# Patient Record
Sex: Male | Born: 1990 | Race: White | Hispanic: No | Marital: Married | Smoking: Never smoker
Health system: Southern US, Community
[De-identification: ages and names within clinical notes are randomized; demographics above are authoritative.]

## PROBLEM LIST (undated history)

## (undated) HISTORY — PX: KNEE SURGERY: SHX244

---

## 2015-09-21 ENCOUNTER — Emergency Department (HOSPITAL_COMMUNITY): Payer: 59

## 2015-09-21 ENCOUNTER — Emergency Department (HOSPITAL_COMMUNITY)
Admission: EM | Admit: 2015-09-21 | Discharge: 2015-09-21 | Disposition: A | Discharge status: Home / Self Care | Payer: 59 | Attending: Emergency Medicine | Admitting: Emergency Medicine

## 2015-09-21 ENCOUNTER — Encounter (HOSPITAL_COMMUNITY): Payer: Self-pay | Admitting: Emergency Medicine

## 2015-09-21 DIAGNOSIS — Y998 Other external cause status: Secondary | ICD-10-CM | POA: Insufficient documentation

## 2015-09-21 DIAGNOSIS — Y9289 Other specified places as the place of occurrence of the external cause: Secondary | ICD-10-CM | POA: Diagnosis not present

## 2015-09-21 DIAGNOSIS — S0990XA Unspecified injury of head, initial encounter: Secondary | ICD-10-CM | POA: Diagnosis present

## 2015-09-21 DIAGNOSIS — Y9367 Activity, basketball: Secondary | ICD-10-CM | POA: Diagnosis not present

## 2015-09-21 DIAGNOSIS — W51XXXA Accidental striking against or bumped into by another person, initial encounter: Secondary | ICD-10-CM | POA: Diagnosis not present

## 2015-09-21 DIAGNOSIS — S060X0A Concussion without loss of consciousness, initial encounter: Secondary | ICD-10-CM | POA: Diagnosis not present

## 2015-09-21 DIAGNOSIS — R42 Dizziness and giddiness: Secondary | ICD-10-CM

## 2015-09-21 IMAGING — CT CT HEAD W/O CM
1 series · 16 of 30 positions shown, 20 images · non-contrast
Comparison: None.

CLINICAL DATA: Concussion playing basketball yesterday. Headache
and dizziness with trouble standing after the injury. Initial
encounter.

EXAM:
CT HEAD WITHOUT CONTRAST
TECHNIQUE: Contiguous axial images were obtained from the base of the skull
through the vertex without intravenous contrast.

[Series 2: head_seq 4.5 h37s st · axial · 0.50mm/px · z∈[-129,+15]mm · 16 of 36 slices shown, 20 images]
[im 2/36  brain]
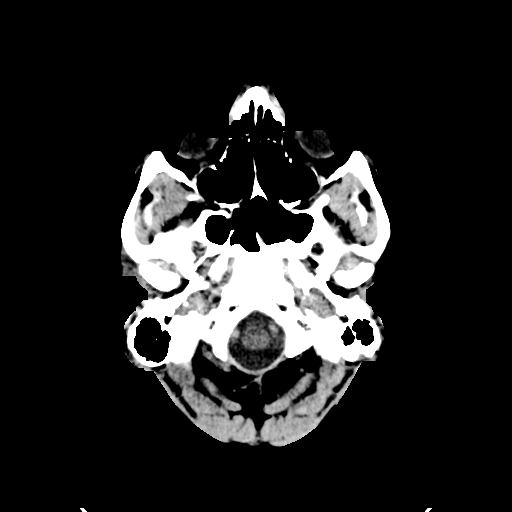
[im 2/36  bone]
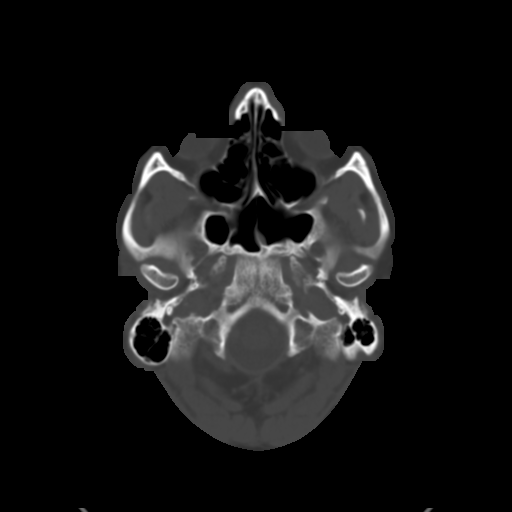
[im 4/36  brain]
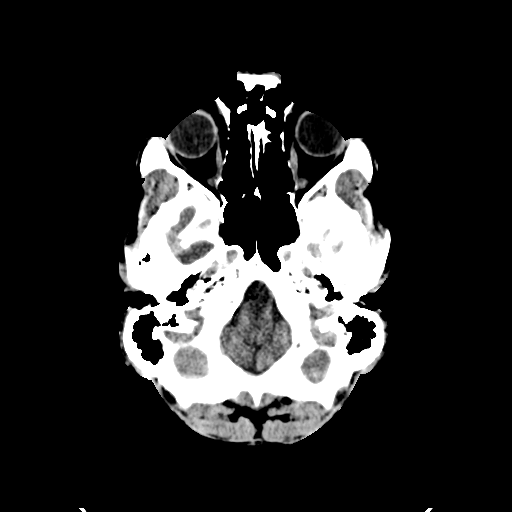
[im 7/36  brain]
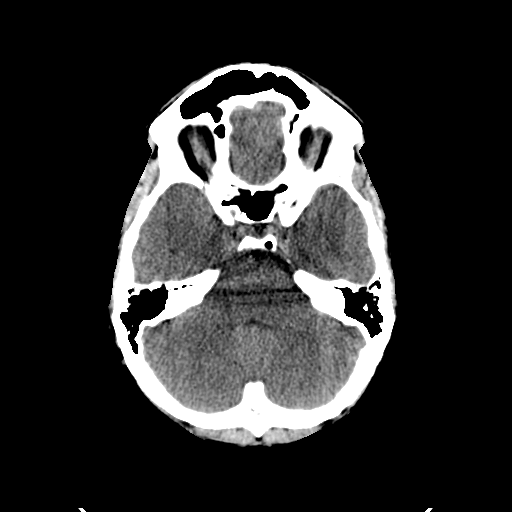
[im 9/36  brain]
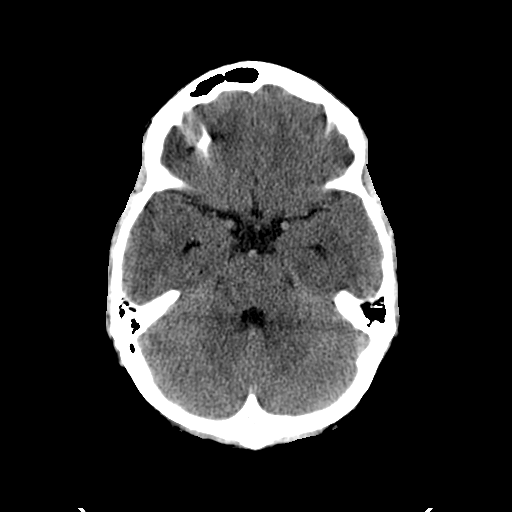
[im 10/36  brain]
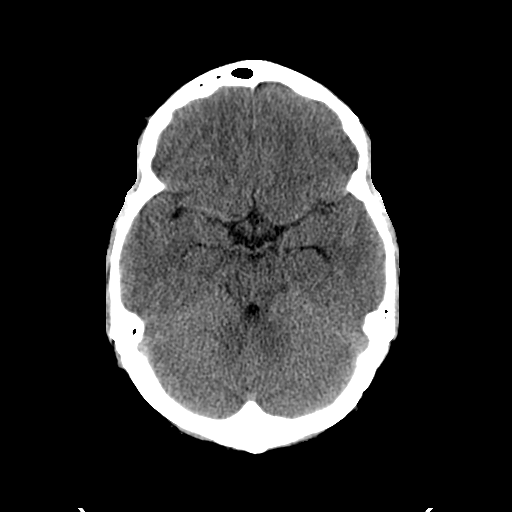
[im 10/36  bone]
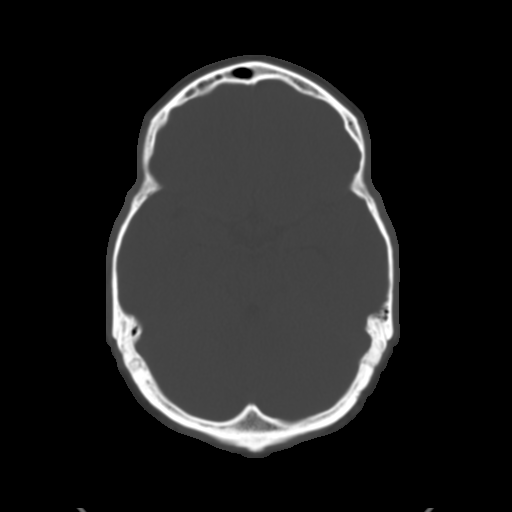
[im 13/36  brain]
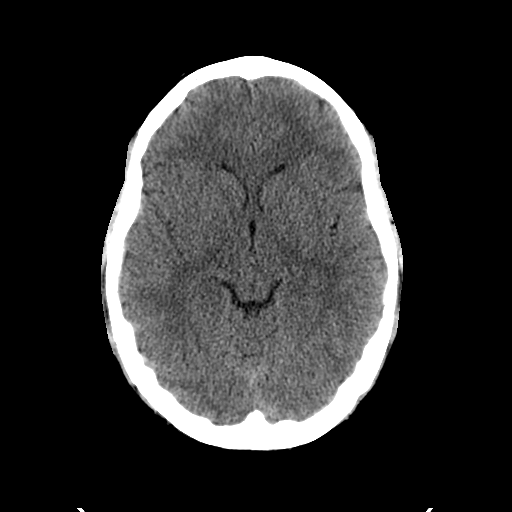
[im 15/36  brain]
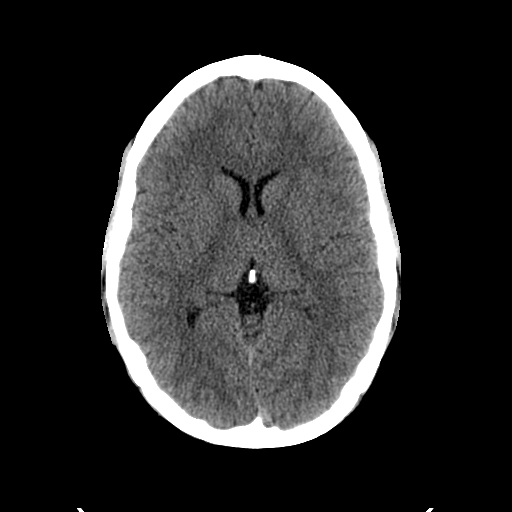
[im 17/36  brain]
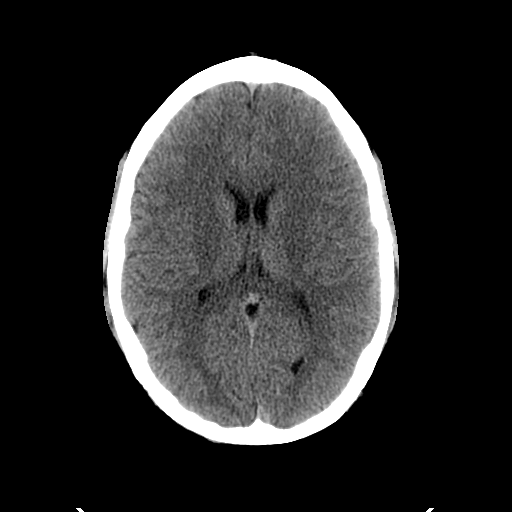
[im 19/36  brain]
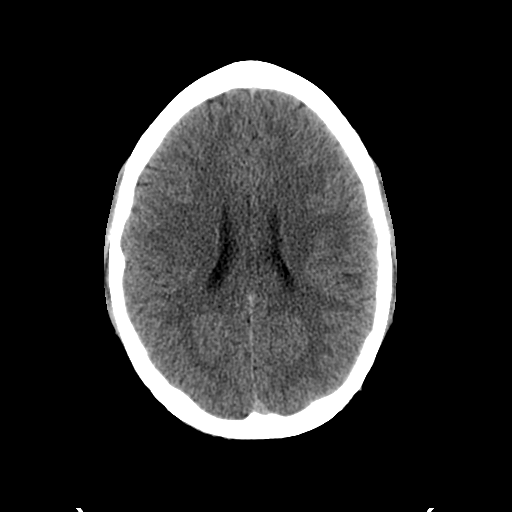
[im 19/36  bone]
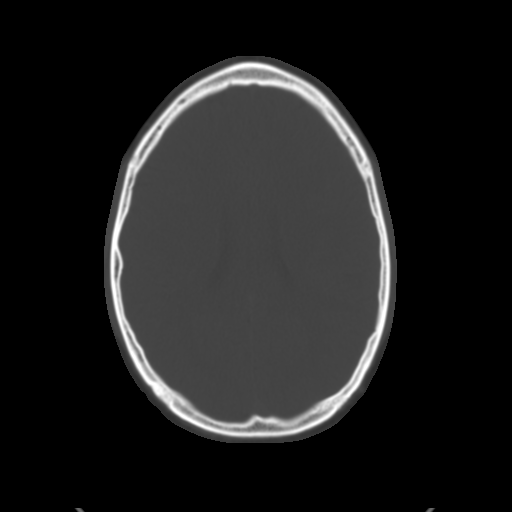
[im 21/36  brain]
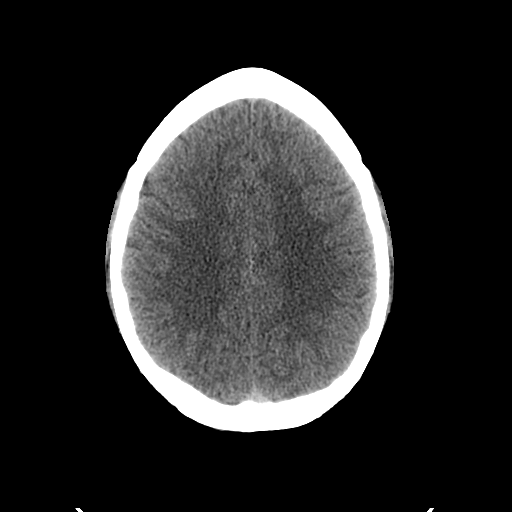
[im 23/36  brain]
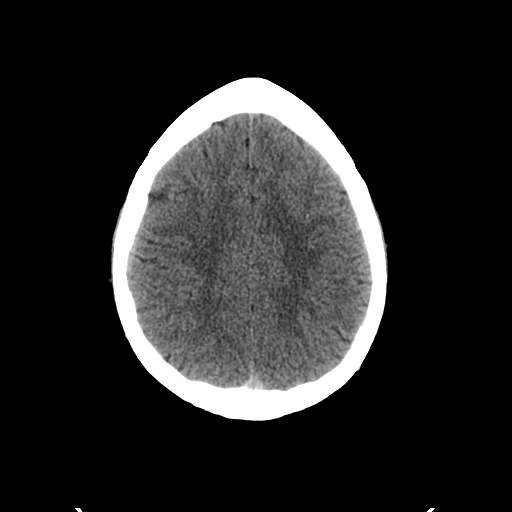
[im 26/36  brain]
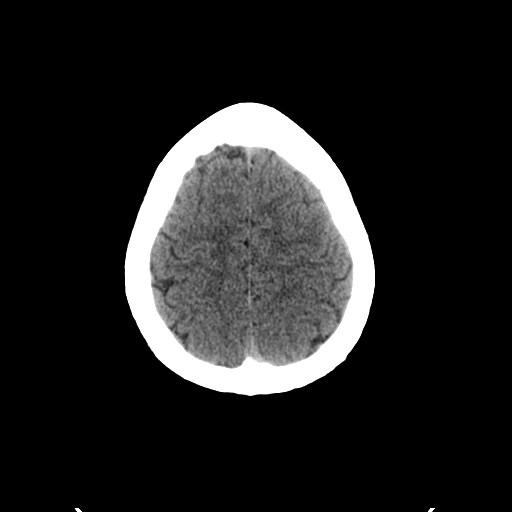
[im 27/36  brain]
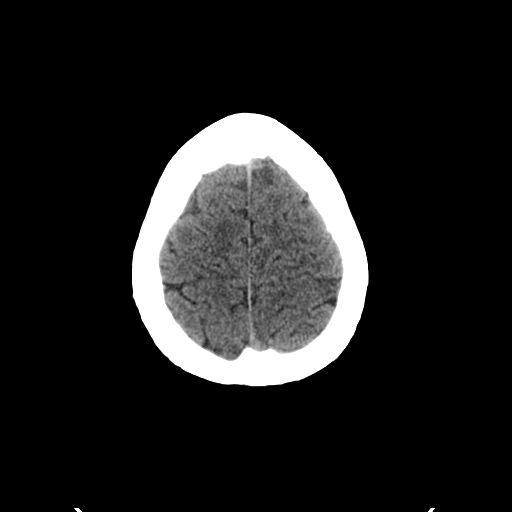
[im 27/36  bone]
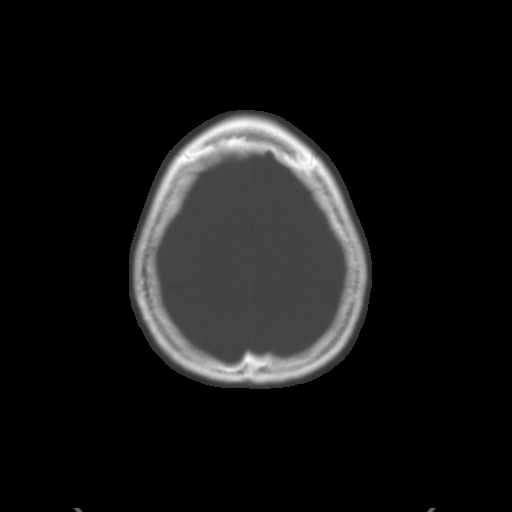
[im 29/36  brain]
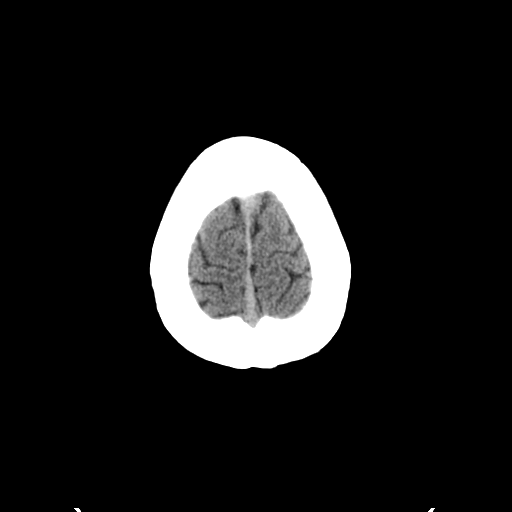
[im 32/36  brain]
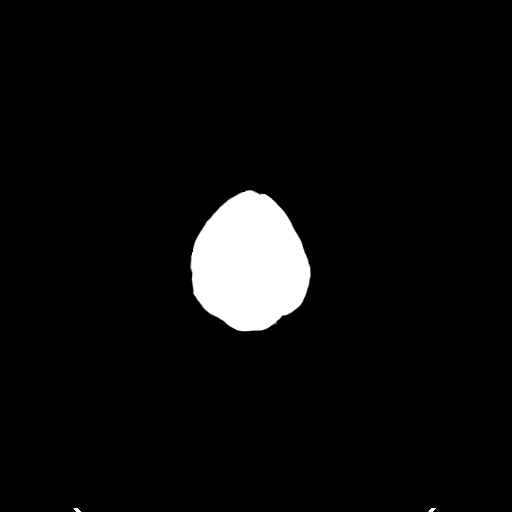
[im 34/36  brain]
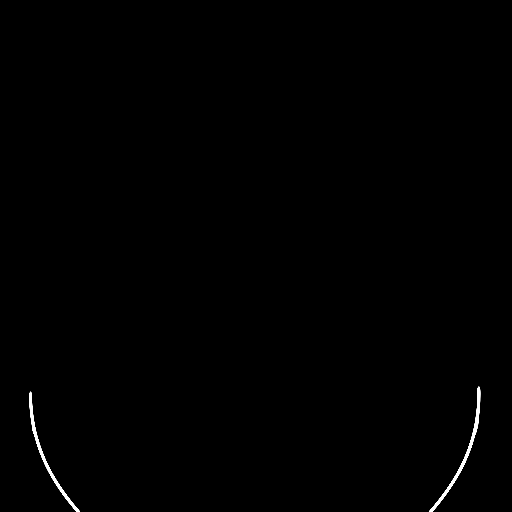

[16 of 30 positions shown; findings below may reference images not displayed]

FINDINGS: Skull and Sinuses:Negative for fracture or hemo sinus.

Visualized orbits: Negative.

Brain:  Normal.  No hemorrhage, swelling, infarct, or hydrocephalus.
IMPRESSION: Normal study.

## 2015-09-21 MED ORDER — MECLIZINE HCL 12.5 MG PO TABS: 12.5000 mg | ORAL_TABLET | Freq: Three times a day (TID) | ORAL | Status: DC | PRN

## 2015-09-21 MED ORDER — HYDROXYZINE HCL 25 MG PO TABS
25.0000 mg | ORAL_TABLET | Freq: Once | ORAL | Status: AC
  Administered 2015-09-21: 25 mg via ORAL
  Filled 2015-09-21: qty 1

## 2015-09-21 NOTE — Discharge Instructions (Signed)
Concussion, Adult  A concussion, or closed-head injury, is a brain injury caused by a direct blow to the head or by a quick and sudden movement (jolt) of the head or neck. Concussions are usually not life-threatening. Even so, the effects of a concussion can be serious. If you have had a concussion before, you are more likely to experience concussion-like symptoms after a direct blow to the head.   CAUSES  · Direct blow to the head, such as from running into another player during a soccer game, being hit in a fight, or hitting your head on a hard surface.  · A jolt of the head or neck that causes the brain to move back and forth inside the skull, such as in a car crash.  SIGNS AND SYMPTOMS  The signs of a concussion can be hard to notice. Early on, they may be missed by you, family members, and health care providers. You may look fine but act or feel differently.  Symptoms are usually temporary, but they may last for days, weeks, or even longer. Some symptoms may appear right away while others may not show up for hours or days. Every head injury is different. Symptoms include:  · Mild to moderate headaches that will not go away.  · A feeling of pressure inside your head.  · Having more trouble than usual:    Learning or remembering things you have heard.    Answering questions.    Paying attention or concentrating.    Organizing daily tasks.    Making decisions and solving problems.  · Slowness in thinking, acting or reacting, speaking, or reading.  · Getting lost or being easily confused.  · Feeling tired all the time or lacking energy (fatigued).  · Feeling drowsy.  · Sleep disturbances.    Sleeping more than usual.    Sleeping less than usual.    Trouble falling asleep.    Trouble sleeping (insomnia).  · Loss of balance or feeling lightheaded or dizzy.  · Nausea or vomiting.  · Numbness or tingling.  · Increased sensitivity to:    Sounds.    Lights.    Distractions.  · Vision problems or eyes that tire  easily.  · Diminished sense of taste or smell.  · Ringing in the ears.  · Mood changes such as feeling sad or anxious.  · Becoming easily irritated or angry for little or no reason.  · Lack of motivation.  · Seeing or hearing things other people do not see or hear (hallucinations).  DIAGNOSIS  Your health care provider can usually diagnose a concussion based on a description of your injury and symptoms. He or she will ask whether you passed out (lost consciousness) and whether you are having trouble remembering events that happened right before and during your injury.  Your evaluation might include:  · A brain scan to look for signs of injury to the brain. Even if the test shows no injury, you may still have a concussion.  · Blood tests to be sure other problems are not present.  TREATMENT  · Concussions are usually treated in an emergency department, in urgent care, or at a clinic. You may need to stay in the hospital overnight for further treatment.  · Tell your health care provider if you are taking any medicines, including prescription medicines, over-the-counter medicines, and natural remedies. Some medicines, such as blood thinners (anticoagulants) and aspirin, may increase the chance of complications. Also tell your health care   provider whether you have had alcohol or are taking illegal drugs. This information may affect treatment.  · Your health care provider will send you home with important instructions to follow.  · How fast you will recover from a concussion depends on many factors. These factors include how severe your concussion is, what part of your brain was injured, your age, and how healthy you were before the concussion.  · Most people with mild injuries recover fully. Recovery can take time. In general, recovery is slower in older persons. Also, persons who have had a concussion in the past or have other medical problems may find that it takes longer to recover from their current injury.  HOME  CARE INSTRUCTIONS  General Instructions  · Carefully follow the directions your health care provider gave you.  · Only take over-the-counter or prescription medicines for pain, discomfort, or fever as directed by your health care provider.  · Take only those medicines that your health care provider has approved.  · Do not drink alcohol until your health care provider says you are well enough to do so. Alcohol and certain other drugs may slow your recovery and can put you at risk of further injury.  · If it is harder than usual to remember things, write them down.  · If you are easily distracted, try to do one thing at a time. For example, do not try to watch TV while fixing dinner.  · Talk with family members or close friends when making important decisions.  · Keep all follow-up appointments. Repeated evaluation of your symptoms is recommended for your recovery.  · Watch your symptoms and tell others to do the same. Complications sometimes occur after a concussion. Older adults with a brain injury may have a higher risk of serious complications, such as a blood clot on the brain.  · Tell your teachers, school nurse, school counselor, coach, athletic trainer, or work manager about your injury, symptoms, and restrictions. Tell them about what you can or cannot do. They should watch for:    Increased problems with attention or concentration.    Increased difficulty remembering or learning new information.    Increased time needed to complete tasks or assignments.    Increased irritability or decreased ability to cope with stress.    Increased symptoms.  · Rest. Rest helps the brain to heal. Make sure you:    Get plenty of sleep at night. Avoid staying up late at night.    Keep the same bedtime hours on weekends and weekdays.    Rest during the day. Take daytime naps or rest breaks when you feel tired.  · Limit activities that require a lot of thought or concentration. These include:    Doing homework or job-related  work.    Watching TV.    Working on the computer.  · Avoid any situation where there is potential for another head injury (football, hockey, soccer, basketball, martial arts, downhill snow sports and horseback riding). Your condition will get worse every time you experience a concussion. You should avoid these activities until you are evaluated by the appropriate follow-up health care providers.  Returning To Your Regular Activities  You will need to return to your normal activities slowly, not all at once. You must give your body and brain enough time for recovery.  · Do not return to sports or other athletic activities until your health care provider tells you it is safe to do so.  · Ask   your health care provider when you can drive, ride a bicycle, or operate heavy machinery. Your ability to react may be slower after a brain injury. Never do these activities if you are dizzy.  · Ask your health care provider about when you can return to work or school.  Preventing Another Concussion  It is very important to avoid another brain injury, especially before you have recovered. In rare cases, another injury can lead to permanent brain damage, brain swelling, or death. The risk of this is greatest during the first 7-10 days after a head injury. Avoid injuries by:  · Wearing a seat belt when riding in a car.  · Drinking alcohol only in moderation.  · Wearing a helmet when biking, skiing, skateboarding, skating, or doing similar activities.  · Avoiding activities that could lead to a second concussion, such as contact or recreational sports, until your health care provider says it is okay.  · Taking safety measures in your home.    Remove clutter and tripping hazards from floors and stairways.    Use grab bars in bathrooms and handrails by stairs.    Place non-slip mats on floors and in bathtubs.    Improve lighting in dim areas.  SEEK MEDICAL CARE IF:  · You have increased problems paying attention or  concentrating.  · You have increased difficulty remembering or learning new information.  · You need more time to complete tasks or assignments than before.  · You have increased irritability or decreased ability to cope with stress.  · You have more symptoms than before.  Seek medical care if you have any of the following symptoms for more than 2 weeks after your injury:  · Lasting (chronic) headaches.  · Dizziness or balance problems.  · Nausea.  · Vision problems.  · Increased sensitivity to noise or light.  · Depression or mood swings.  · Anxiety or irritability.  · Memory problems.  · Difficulty concentrating or paying attention.  · Sleep problems.  · Feeling tired all the time.  SEEK IMMEDIATE MEDICAL CARE IF:  · You have severe or worsening headaches. These may be a sign of a blood clot in the brain.  · You have weakness (even if only in one hand, leg, or part of the face).  · You have numbness.  · You have decreased coordination.  · You vomit repeatedly.  · You have increased sleepiness.  · One pupil is larger than the other.  · You have convulsions.  · You have slurred speech.  · You have increased confusion. This may be a sign of a blood clot in the brain.  · You have increased restlessness, agitation, or irritability.  · You are unable to recognize people or places.  · You have neck pain.  · It is difficult to wake you up.  · You have unusual behavior changes.  · You lose consciousness.  MAKE SURE YOU:  · Understand these instructions.  · Will watch your condition.  · Will get help right away if you are not doing well or get worse.     This information is not intended to replace advice given to you by your health care provider. Make sure you discuss any questions you have with your health care provider.     Document Released: 08/11/2003 Document Revised: 06/11/2014 Document Reviewed: 12/11/2012  Elsevier Interactive Patient Education ©2016 [employer]

## 2015-09-21 NOTE — ED Provider Notes (Signed)
CSN: [id number]     Arrival date & time 09/21/15  1240 History   First MD Initiated Contact with Patient 09/21/15 1247     Chief Complaint  Patient presents with  . Head Injury  . Dizziness     (Consider location/radiation/quality/duration/timing/severity/associated sxs/prior Treatment) The history is provided by the patient.     PT SAID THAT HE WAS PLAYING BASKETBALL LAST NIGHT AND WAS HIT IN THE HEAD WITH SOMEONE'S ELBOW.  THE PT FELT DIZZY, BUT WENT BACK TO PLAYING.  HE DID NOT HAVE A LOC.  THE PT FELT OK LAST NIGHT.  TODAY, HE WOKE UP AND FELT DIZZY.  History reviewed. No pertinent past medical history. Past Surgical History  Procedure Laterality Date  . Knee surgery     History reviewed. No pertinent family history. Social History  Substance Use Topics  . Smoking status: None  . Smokeless tobacco: None  . Alcohol Use: None    Review of Systems  Neurological: Positive for dizziness.  All other systems reviewed and are negative.     Allergies  Review of patient's allergies indicates no known allergies.  Home Medications   Prior to Admission medications   Medication Sig Start Date End Date Taking? Authorizing Provider  aspirin-acetaminophen-caffeine (EXCEDRIN MIGRAINE) [phone] MG tablet Take 1 tablet by mouth every 6 (six) hours as needed for headache.   Yes Historical Provider, MD  diphenhydrAMINE (BENADRYL) 25 MG tablet Take 25 mg by mouth every 6 (six) hours as needed for allergies.   Yes Historical Provider, MD  ibuprofen (ADVIL,MOTRIN) 200 MG tablet Take 400 mg by mouth every 6 (six) hours as needed for headache.   Yes Historical Provider, MD  naproxen sodium (ANAPROX) 220 MG tablet Take 220 mg by mouth 2 (two) times daily as needed (headache).   Yes Historical Provider, MD   BP 146/94 mmHg  Pulse 66  Temp(Src) 97.7 F (36.5 C) (Oral)  Resp 18  SpO2 100% Physical Exam  Constitutional: He is oriented to person, place, and time. He appears well-developed  and well-nourished.  HENT:  Head: Normocephalic and atraumatic.  Right Ear: External ear normal.  Left Ear: External ear normal.  Mouth/Throat: Oropharynx is clear and moist.  Eyes: Conjunctivae are normal. Pupils are equal, round, and reactive to light.  Neck: Normal range of motion. Neck supple.  Cardiovascular: Normal rate, regular rhythm, normal heart sounds and intact distal pulses.   Pulmonary/Chest: Effort normal and breath sounds normal.  Abdominal: Soft. Bowel sounds are normal.  Musculoskeletal: Normal range of motion.  Neurological: He is alert and oriented to person, place, and time.  Skin: Skin is warm and dry.  Psychiatric: He has a normal mood and affect.  Vitals reviewed.   ED Course  Procedures (including critical care time) Labs Review Labs Reviewed - No data to display  Imaging Review Ct Head Wo Contrast  09/21/2015  CLINICAL DATA:  Concussion playing basketball yesterday. Headache and dizziness with trouble standing after the injury. Initial encounter. EXAM: CT HEAD WITHOUT CONTRAST TECHNIQUE: Contiguous axial images were obtained from the base of the skull through the vertex without intravenous contrast. COMPARISON:  None. FINDINGS: Skull and Sinuses:Negative for fracture or hemo sinus. Visualized orbits: Negative. Brain:  Normal.  No hemorrhage, swelling, infarct, or hydrocephalus. IMPRESSION: Normal study. Electronically Signed   By: Marnee SpringJonathon  Watts M.D.   On: 09/21/2015 13:45   I have personally reviewed and evaluated these images and lab results as part of my medical decision-making.   EKG  Interpretation None      MDM  PT IMPROVED. Final diagnoses:  None   CONCUSSION VERTIGO    Jacalyn Lefevre, MD 09/21/15 1402

## 2015-09-21 NOTE — ED Notes (Signed)
Playing basketball yesterday, took a shoulder to the head. Did not lose consciousness, c/o headache, dizziness and trouble standing shortly after the injury. Today states the symptoms are worse with nausea, dizziness, headache, feeling shaky, and having difficulty concentrating.

## 2015-09-21 NOTE — ED Notes (Signed)
Patient transported to CT 

## 2016-10-26 LAB — BASIC METABOLIC PANEL
BUN: 15 (ref 4–21)
CO2: 29 — AB (ref 13–22)
Chloride: 104 (ref 99–108)
Creatinine: 0.9 (ref 0.6–1.3)
Glucose: 103
Potassium: 4.3 (ref 3.4–5.3)
Sodium: 141 (ref 137–147)

## 2016-10-26 LAB — PSA: PSA: 0.89

## 2019-10-29 ENCOUNTER — Ambulatory Visit (INDEPENDENT_AMBULATORY_CARE_PROVIDER_SITE_OTHER): Payer: Commercial Managed Care - PPO | Admitting: Osteopathic Medicine

## 2019-10-29 ENCOUNTER — Encounter: Payer: Self-pay | Admitting: Osteopathic Medicine

## 2019-10-29 VITALS — BP 116/83 | HR 63 | Ht 69.0 in | Wt 173.0 lb

## 2019-10-29 DIAGNOSIS — Z0000 Encounter for general adult medical examination without abnormal findings: Secondary | ICD-10-CM | POA: Diagnosis not present

## 2019-10-29 NOTE — Progress Notes (Signed)
Dean Spence is a 29 y.o. male who presents to  [facility]  today, 10/29/19, seeking care for the following: . Annual check-up     ASSESSMENT & PLAN with other pertinent history/findings:  The encounter diagnosis was Annual physical exam.     Patient Instructions  General Preventive Care  Most recent routine screening labs: ordered today.   Everyone should have blood pressure checked once per year. Goal 130/80 or less.   Tobacco: don't!   Alcohol: responsible moderation is ok for most adults - if you have concerns about your alcohol intake, please talk to me!   Exercise: as tolerated to reduce risk of cardiovascular disease and diabetes. Strength training will also prevent osteoporosis.   Mental health: if need for mental health care (medicines, counseling, other), or concerns about moods, please let me know!   Sexual health: if need for STD testing, or if concerns with libido/pain problems, please let me know!   Advanced Directive: Living Will and/or Healthcare Power of Attorney recommended for all adults, regardless of age or health.  Vaccines  Flu vaccine: for almost everyone, every fall.   Shingles vaccine: after age 53.   Pneumonia vaccines: after age 24  Tetanus booster: every 10 years   HPV vaccine: Gardasil up to age 3 to prevent HPV-associated diseases, including certain cancers.   COVID vaccine: THANKS! :) Cancer screenings   Colon cancer screening: for everyone age 63-75.   Prostate cancer screening: PSA blood test age 40-71  Lung cancer screening: not needed for non-smokers  Infection screenings  . HIV: recommended screening at least once age 20-65, more often as needed. . Gonorrhea/Chlamydia: screening as needed . Hepatitis C: recommended once for everyone age 81-75 . TB: certain at-risk populations, or depending on work requirements and/or travel history Other . Bone Density Test:  recommended at age 67, sooner depending on risk factors . Abdominal Aortic Aneurysm: not needed for non-smokers     Orders Placed This Encounter  Procedures  . CBC  . COMPLETE METABOLIC PANEL WITH GFR  . Lipid panel    No orders of the defined types were placed in this encounter.    Constitutional:  . VSS, see nurse notes . General Appearance: alert, well-developed, well-nourished, NAD Eyes: Marland Kitchen Normal lids and conjunctive, non-icteric sclera . PERRLA Neck: . No masses, trachea midline . No thyroid enlargement/tenderness/mass appreciated Respiratory: . Normal respiratory effort . No dullness/hyper-resonance to percussion . Breath sounds normal, no wheeze/rhonchi/rales Cardiovascular: . S1/S2 normal, no murmur/rub/gallop auscultated . No lower extremity edema Gastrointestinal: . Nontender, no masses . No hepatomegaly, no splenomegaly . No hernia appreciated Musculoskeletal:  . Gait normal . No clubbing/cyanosis of digits Neurological: . No cranial nerve deficit on limited exam . Motor and sensation intact and symmetric Psychiatric: . Normal judgment/insight . Normal mood and affect   Follow-up instructions: Return in about 1 year (around 10/28/2020) for [place] (call week prior to visit for lab orders).                                         BP 116/83 (BP Location: Left Arm, Patient Position: Sitting)   Pulse 63   Ht 5\' 9"  (1.753 m)   Wt 173 lb (78.5 kg)   SpO2 100%   BMI 25.55 kg/m   No outpatient medications have been marked as taking for the  10/29/19 encounter (Office Visit) with Sunnie Nielsen, DO.    No results found for this or any previous visit (from the past 72 hour(s)).  No results found.  Depression screen [facility] 2/9 10/29/2019  Decreased Interest 0  Down, Depressed, Hopeless 0  PHQ - 2 Score 0  Altered sleeping 2  Tired, decreased energy 0  Change in appetite 0  Feeling bad or failure about yourself   0  Trouble concentrating 0  Moving slowly or fidgety/restless 0  PHQ-9 Score 2  Difficult doing work/chores Somewhat difficult    GAD 7 : Generalized Anxiety Score 10/29/2019  Nervous, Anxious, on Edge 1  Control/stop worrying 1  Worry too much - different things 1  Trouble relaxing 1  Restless 0  Easily annoyed or irritable 0  Afraid - awful might happen 1  Total GAD 7 Score 5  Anxiety Difficulty Somewhat difficult      All questions at time of visit were answered - patient instructed to contact office with any additional concerns or updates.  ER/RTC precautions were reviewed with the patient.  Please note: voice recognition software was used to produce this document, and typos may escape review. Please contact Dr. Lyn Hollingshead for any needed clarifications.

## 2019-10-29 NOTE — Patient Instructions (Signed)
General Preventive Care  Most recent routine screening labs: ordered today.   Everyone should have blood pressure checked once per year. Goal 130/80 or less.   Tobacco: don't!   Alcohol: responsible moderation is ok for most adults - if you have concerns about your alcohol intake, please talk to me!   Exercise: as tolerated to reduce risk of cardiovascular disease and diabetes. Strength training will also prevent osteoporosis.   Mental health: if need for mental health care (medicines, counseling, other), or concerns about moods, please let me know!   Sexual health: if need for STD testing, or if concerns with libido/pain problems, please let me know!   Advanced Directive: Living Will and/or Healthcare Power of Attorney recommended for all adults, regardless of age or health.  Vaccines  Flu vaccine: for almost everyone, every fall.   Shingles vaccine: after age 58.   Pneumonia vaccines: after age 22  Tetanus booster: every 10 years   HPV vaccine: Gardasil up to age 30 to prevent HPV-associated diseases, including certain cancers.   COVID vaccine: THANKS! :) Cancer screenings   Colon cancer screening: for everyone age 55-75.   Prostate cancer screening: PSA blood test age 28-71  Lung cancer screening: not needed for non-smokers  Infection screenings  . HIV: recommended screening at least once age 71-65, more often as needed. . Gonorrhea/Chlamydia: screening as needed . Hepatitis C: recommended once for everyone age 63-75 . TB: certain at-risk populations, or depending on work requirements and/or travel history Other . Bone Density Test: recommended at age 66, sooner depending on risk factors . Abdominal Aortic Aneurysm: not needed for non-smokers

## 2019-10-31 LAB — COMPLETE METABOLIC PANEL WITH GFR
AG Ratio: 2 (calc) (ref 1.0–2.5)
ALT: 9 U/L (ref 9–46)
AST: 12 U/L (ref 10–40)
Albumin: 4.7 g/dL (ref 3.6–5.1)
Alkaline phosphatase (APISO): 69 U/L (ref 36–130)
BUN: 19 mg/dL (ref 7–25)
CO2: 27 mmol/L (ref 20–32)
Calcium: 9.7 mg/dL (ref 8.6–10.3)
Chloride: 103 mmol/L (ref 98–110)
Creat: 0.98 mg/dL (ref 0.60–1.35)
GFR, Est African American: 121 mL/min/{1.73_m2} (ref >=60)
GFR, Est Non African American: 104 mL/min/{1.73_m2} (ref >=60)
Globulin: 2.3 g/dL (calc) (ref 1.9–3.7)
Glucose, Bld: 105 mg/dL — ABNORMAL HIGH (ref 65–99)
Potassium: 4.6 mmol/L (ref 3.5–5.3)
Sodium: 141 mmol/L (ref 135–146)
Total Bilirubin: 0.5 mg/dL (ref 0.2–1.2)
Total Protein: 7 g/dL (ref 6.1–8.1)

## 2019-10-31 LAB — CBC
HCT: 41.7 % (ref 38.5–50.0)
Hemoglobin: 14.8 g/dL (ref 13.2–17.1)
MCH: 31.6 pg (ref 27.0–33.0)
MCHC: 35.5 g/dL (ref 32.0–36.0)
MCV: 88.9 fL (ref 80.0–100.0)
MPV: 11.2 fL (ref 7.5–12.5)
Platelets: 191 10*3/uL (ref 140–400)
RBC: 4.69 10*6/uL (ref 4.20–5.80)
RDW: 11.9 % (ref 11.0–15.0)
WBC: 6.1 10*3/uL (ref 3.8–10.8)

## 2019-10-31 LAB — LIPID PANEL
Cholesterol: 197 mg/dL (ref <200)
HDL: 53 mg/dL (ref >=40)
LDL Cholesterol (Calc): 122 mg/dL (calc) — ABNORMAL HIGH
Non-HDL Cholesterol (Calc): 144 mg/dL (calc) — ABNORMAL HIGH (ref <130)
Total CHOL/HDL Ratio: 3.7 (calc) (ref <5.0)
Triglycerides: 112 mg/dL (ref <150)

## 2019-11-04 ENCOUNTER — Encounter: Payer: Self-pay | Admitting: Osteopathic Medicine

## 2019-11-04 DIAGNOSIS — R7301 Impaired fasting glucose: Secondary | ICD-10-CM

## 2019-11-04 DIAGNOSIS — E7800 Pure hypercholesterolemia, unspecified: Secondary | ICD-10-CM

## 2019-11-06 ENCOUNTER — Encounter: Payer: Self-pay | Admitting: Osteopathic Medicine

## 2019-11-06 NOTE — Telephone Encounter (Signed)
Pt sent a message on 11/04/19.

## 2020-02-11 ENCOUNTER — Encounter: Payer: Self-pay | Admitting: Osteopathic Medicine

## 2020-02-11 ENCOUNTER — Ambulatory Visit (INDEPENDENT_AMBULATORY_CARE_PROVIDER_SITE_OTHER): Payer: Commercial Managed Care - PPO | Admitting: Osteopathic Medicine

## 2020-02-11 ENCOUNTER — Other Ambulatory Visit: Payer: Self-pay

## 2020-02-11 VITALS — BP 134/86 | HR 62 | Wt 172.0 lb

## 2020-02-11 DIAGNOSIS — S83511A Sprain of anterior cruciate ligament of right knee, initial encounter: Secondary | ICD-10-CM

## 2020-02-11 DIAGNOSIS — S8991XA Unspecified injury of right lower leg, initial encounter: Secondary | ICD-10-CM

## 2020-02-11 NOTE — Patient Instructions (Signed)
Should get call re: MRI and ortho referral

## 2020-02-11 NOTE — Progress Notes (Signed)
Dean Spence is a 29 y.o. male who presents to  [facility]  today, 02/11/20, seeking care for the following:  . R knee pain - playing basketball, twisted and felt/heard a pop and felt knee instability on R. Hx previous knee surgeries, including ACL repair on R. He reports he's done w/ competitive sports, but wants to continue with golfing and hiking. Has been icing, compressing the knee, using NSAID's qhs. (+)pain to LCL, pain but no serious instability w/ posterior drawer. No significant effusion noted.      ASSESSMENT & PLAN with other pertinent findings:  The encounter diagnosis was Right knee injury, initial encounter - suspect ACL/PCL tear, meniscal involvement. Previous Biochemist, clinical .    Patient Instructions  Should get call re: MRI and ortho referral    Orders Placed This Encounter  Procedures  . MR Knee Right Wo Contrast    No orders of the defined types were placed in this encounter.      Follow-up instructions: Return if symptoms worsen or fail to improve.                                         BP 134/86 (BP Location: Right Arm, Patient Position: Sitting)   Pulse 62   Wt 172 lb (78 kg)   SpO2 100%   BMI 25.40 kg/m   No outpatient medications have been marked as taking for the 02/11/20 encounter (Office Visit) with Sunnie Nielsen, DO.    No results found for this or any previous visit (from the past 72 hour(s)).  No results found.     All questions at time of visit were answered - patient instructed to contact office with any additional concerns or updates.  ER/RTC precautions were reviewed with the patient as applicable.   Please note: voice recognition software was used to produce this document, and typos may escape review. Please contact Dr. Lyn Hollingshead for any needed clarifications.

## 2020-02-14 ENCOUNTER — Other Ambulatory Visit: Payer: Self-pay

## 2020-02-14 ENCOUNTER — Ambulatory Visit (INDEPENDENT_AMBULATORY_CARE_PROVIDER_SITE_OTHER): Payer: Commercial Managed Care - PPO

## 2020-02-14 DIAGNOSIS — S8991XA Unspecified injury of right lower leg, initial encounter: Secondary | ICD-10-CM

## 2020-02-14 IMAGING — MR MR KNEE*R* W/O CM
7 series · 40 of 40 positions shown · non-contrast
Comparison: None.

CLINICAL DATA: Injured knee 10 days ago. Persistent pain. Prior
history of ACL reconstruction.

EXAM:
MRI OF THE RIGHT KNEE WITHOUT CONTRAST
TECHNIQUE: Multiplanar, multisequence MR imaging of the knee was performed. No
intravenous contrast was administered.

[Series 3: T2 fat-sat · axial · 4.0mm · 0.53mm/px · z∈[-98,+72]mm · 6 of 35 slices shown (1 of 3)]
[im 1/35]
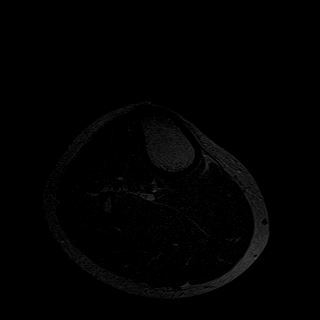
[im 7/35]
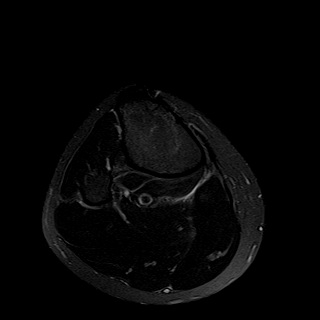
[im 14/35]
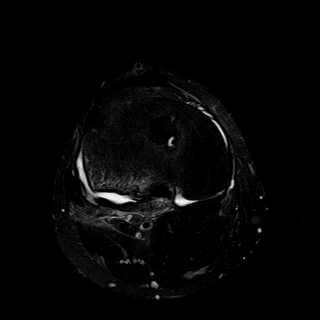
[im 21/35]
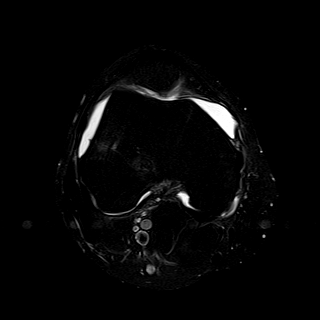
[im 28/35]
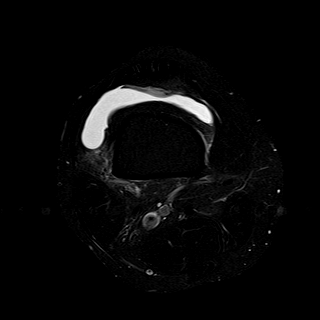
[im 35/35]
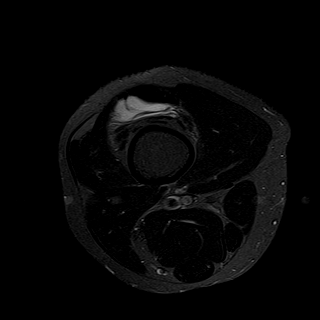

[Series 4: T1 · coronal · 4.0mm · 0.62mm/px · 6 of 29 slices shown]
[im 1/29]
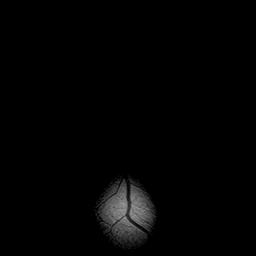
[im 6/29]
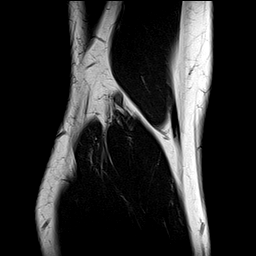
[im 12/29]
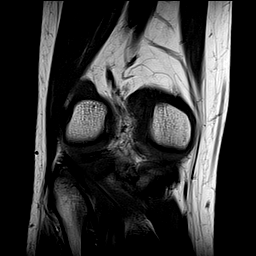
[im 17/29]
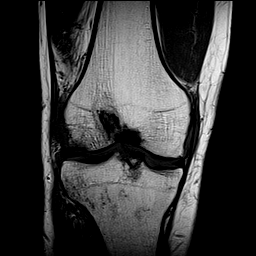
[im 23/29]
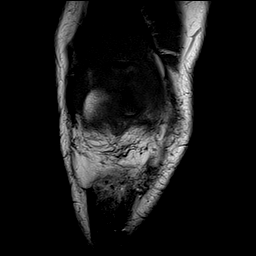
[im 29/29]
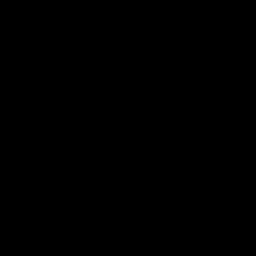

[Series 5: T2 fat-sat · coronal · 4.0mm · 0.62mm/px · 6 of 29 slices shown (2 of 3)]
[im 1/29]
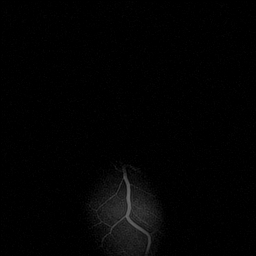
[im 6/29]
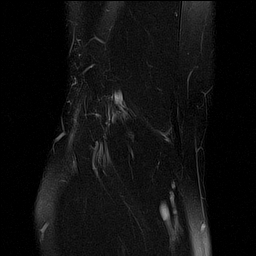
[im 12/29]
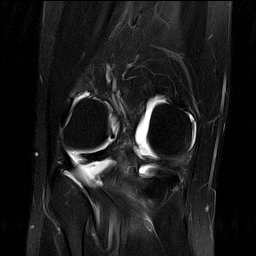
[im 17/29]
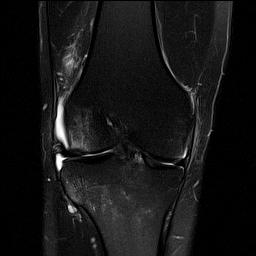
[im 23/29]
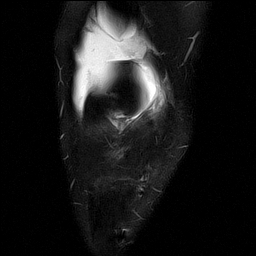
[im 29/29]
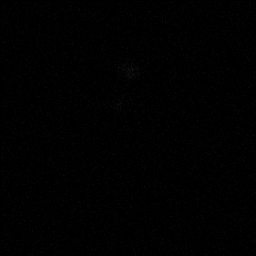

[Series 6: PD fat-sat · coronal · 4.0mm · 0.62mm/px · 6 of 29 slices shown (1 of 3)]
[im 1/29]
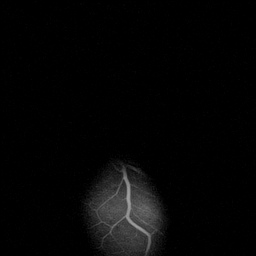
[im 6/29]
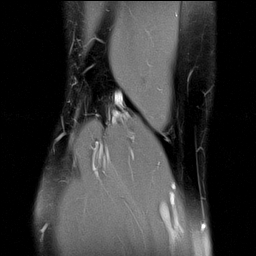
[im 12/29]
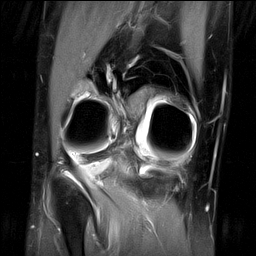
[im 17/29]
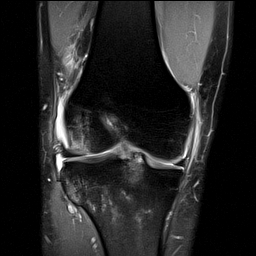
[im 23/29]
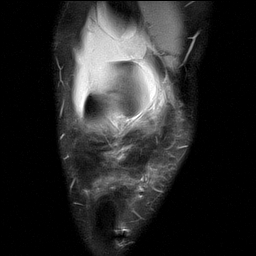
[im 29/29]
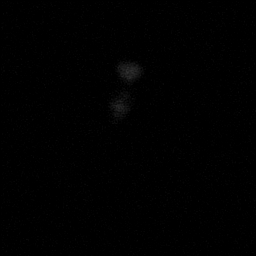

[Series 7: PD fat-sat · sagittal · 3.0mm · 0.62mm/px · 6 of 30 slices shown (2 of 3)]
[im 1/30]
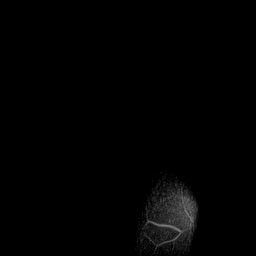
[im 6/30]
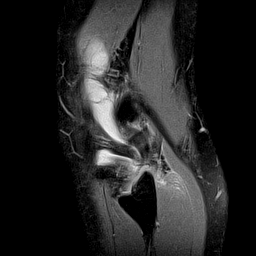
[im 12/30]
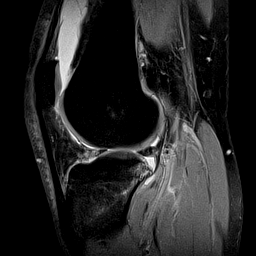
[im 18/30]
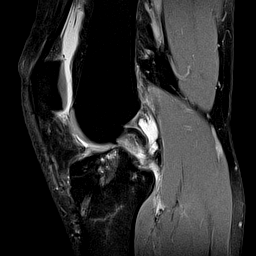
[im 24/30]
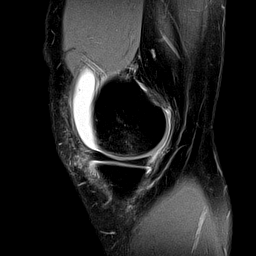
[im 30/30]
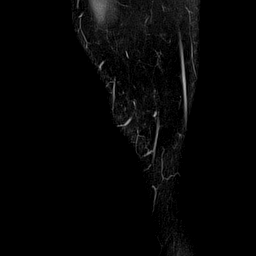

[Series 8: T2 fat-sat · sagittal · 3.0mm · 0.62mm/px · 6 of 30 slices shown (3 of 3)]
[im 1/30]
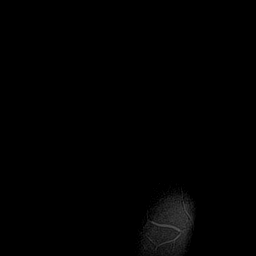
[im 6/30]
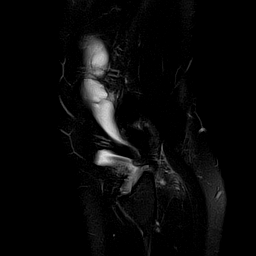
[im 12/30]
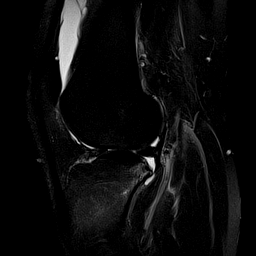
[im 18/30]
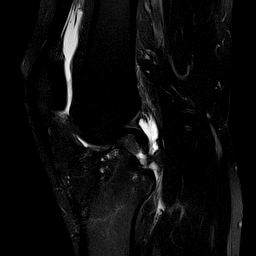
[im 24/30]
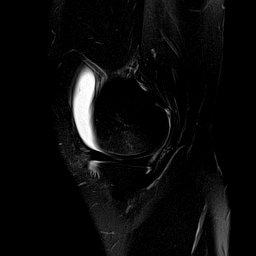
[im 30/30]
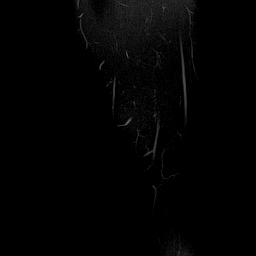

[Series 9: PD fat-sat · oblique · 2.0mm · 0.62mm/px · 4 of 19 slices shown (3 of 3)]
[im 1/19]
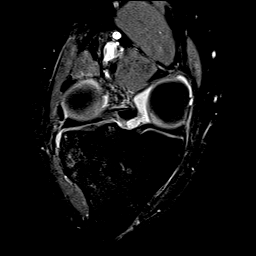
[im 7/19]
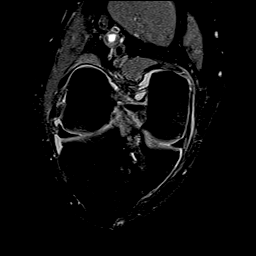
[im 13/19]
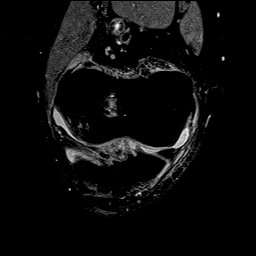
[im 19/19]
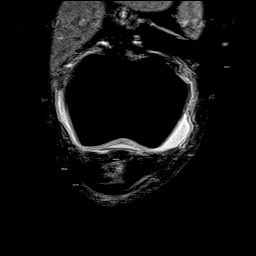

[40 of 40 positions shown; findings below may reference images not displayed]

FINDINGS: MENISCI

Medial meniscus:  Intact

Lateral meniscus:  Intact

LIGAMENTS

Cruciates: The ACL graft is ruptured. The PCL is intact but buckled.

Collaterals:  Intact.

CARTILAGE

Patellofemoral:  Normal

Medial:  Normal

Lateral:  Normal

Joint: Large joint effusion. Superior and medial patellar plica
noted.

Popliteal Fossa:  No popliteal mass or Baker's cyst.

Extensor Mechanism: The patella retinacular structures are intact
and the quadriceps and patellar tendons are intact. Surgical changes
related to prior patellar tendon graft harvest.

Bones:  Pivot-shift type bone contusions without discrete fracture.

Other: Unremarkable knee musculature.
IMPRESSION: 1. Complete ACL graft rupture with associated pivot-shift bone
contusions. The PCL and collateral ligaments are intact.
2. No meniscal tears and intact articular cartilage.
3. Large joint effusion.

## 2020-02-16 NOTE — Progress Notes (Signed)
This was the guy I messaged you about last week - recommendations? Varkey? Thanks!

## 2020-02-16 NOTE — Addendum Note (Signed)
Addended by: Deirdre Pippins on: 02/16/2020 01:07 PM   Modules accepted: Orders

## 2020-03-28 ENCOUNTER — Encounter: Payer: Self-pay | Admitting: Osteopathic Medicine

## 2020-12-19 ENCOUNTER — Encounter: Payer: Self-pay | Admitting: Osteopathic Medicine

## 2020-12-19 DIAGNOSIS — H9311 Tinnitus, right ear: Secondary | ICD-10-CM

## 2020-12-27 ENCOUNTER — Encounter: Payer: Self-pay | Admitting: Medical-Surgical

## 2020-12-27 ENCOUNTER — Other Ambulatory Visit: Payer: Self-pay

## 2020-12-27 ENCOUNTER — Ambulatory Visit (INDEPENDENT_AMBULATORY_CARE_PROVIDER_SITE_OTHER): Payer: Commercial Managed Care - PPO | Admitting: Medical-Surgical

## 2020-12-27 VITALS — BP 132/83 | HR 65 | Resp 20 | Ht 69.0 in | Wt 178.0 lb

## 2020-12-27 DIAGNOSIS — H9319 Tinnitus, unspecified ear: Secondary | ICD-10-CM | POA: Diagnosis not present

## 2020-12-27 DIAGNOSIS — E7800 Pure hypercholesterolemia, unspecified: Secondary | ICD-10-CM

## 2020-12-27 DIAGNOSIS — H6123 Impacted cerumen, bilateral: Secondary | ICD-10-CM | POA: Diagnosis not present

## 2020-12-27 DIAGNOSIS — R7301 Impaired fasting glucose: Secondary | ICD-10-CM

## 2020-12-27 DIAGNOSIS — Z0000 Encounter for general adult medical examination without abnormal findings: Secondary | ICD-10-CM

## 2020-12-27 DIAGNOSIS — M25562 Pain in left knee: Secondary | ICD-10-CM | POA: Diagnosis not present

## 2020-12-27 NOTE — Progress Notes (Signed)
HPI with pertinent ROS:   CC: ringing in the ear  HPI: Pleasant 30 year old male presenting for the following:  Tendinitis/cerumen impaction: Previously seen for right ear ringing in urgent care and was told that he had cerumen impaction.  They did do a lavage for when able to clear the wax completely.  He has been using the antibiotic drops that were prescribed for him.  He has been trying to clear the wax out at home and was able to clear quite a bit from his right ear last night.  He does continue to have ringing in the right ear but notes that his hearing is 85 to 90% back.  He does have a referral in place to ENT and has an appointment with them in about 3 weeks.  No fever, chills, ear drainage, dizziness, headaches, or nausea.  Left knee pain-he had surgery on his left knee approximately 8 years ago to repair a meniscus issue.  Approximately 2 months ago, he went on a beach trip.  Admits to lifting heavy luggage upstairs as well as being very active on the beach.  The next morning, he woke up with left knee pain that was debilitating and had trouble walking on it.  He has been working with physical therapy for the last 2 months with this knee pain but his symptoms have not completely resolved.  Now his pain is reduced but he does feel like he is hyperextending the knee whenever he walks.  He has had no swelling of the knee.  He has been using some intermittent light bracing but has not been taking anti-inflammatories as his pain was not severe.  Has not had any recent images of the left knee.  I reviewed the past medical history, family history, social history, surgical history, and allergies today and no changes were needed.  Please see the problem list section below in epic for further details.  Physical exam:   General: Well Developed, well nourished, and in no acute distress.  Neuro: Alert and oriented x3.  HEENT: Normocephalic, atraumatic.  Right external ear canal with moderate cerumen  buildup.  Unable to visualize left TM due to cerumen impaction. Skin: Warm and dry. Cardiac: Regular rate and rhythm, no murmurs rubs or gallops, no lower extremity edema.  Respiratory: Clear to auscultation bilaterally. Not using accessory muscles, speaking in full sentences. Left knee: Full range of motion to the left knee although extension does appear to be a bit hyperextended compared to the right knee.  He has had surgery in the right knee so this may not be a good comparison.  He is ambulating without difficulty.  No tenderness, instability, or effusion noted.  Impression and Recommendations:    1. Tinnitus, unspecified laterality May be related to eustachian tube dysfunction but also could be from cerumen impaction.  Recommend following up with ENT for further evaluation  2. Acute pain of left knee Getting x-rays today.  He has been doing therapy for approximately 2 months so he has failed conservative treatment.  If x-ray result comes back showing no significant identifiable issues, we will likely send off for MRI.  He already has a relationship with Dr. Everardo Pacific so no referrals placed today. - DG Knee Complete 4 Views Left; Future  3. Bilateral impacted cerumen Bilateral ear impaction noted.  Ear lavage completed by CMA in office.  After lavage, patient reported significant improvement in hearing. - Ear Lavage  Return if symptoms worsen or fail to improve. ___________________________________________ Ander Slade L.  Charna Archer, DNP, APRN, FNP-BC [facility]

## 2020-12-29 ENCOUNTER — Other Ambulatory Visit: Payer: Self-pay

## 2020-12-29 ENCOUNTER — Ambulatory Visit (INDEPENDENT_AMBULATORY_CARE_PROVIDER_SITE_OTHER): Payer: Commercial Managed Care - PPO

## 2020-12-29 DIAGNOSIS — M25562 Pain in left knee: Secondary | ICD-10-CM

## 2020-12-29 DIAGNOSIS — R7301 Impaired fasting glucose: Secondary | ICD-10-CM

## 2020-12-29 DIAGNOSIS — Z0000 Encounter for general adult medical examination without abnormal findings: Secondary | ICD-10-CM

## 2020-12-29 DIAGNOSIS — E7800 Pure hypercholesterolemia, unspecified: Secondary | ICD-10-CM

## 2020-12-29 IMAGING — DX DG KNEE COMPLETE 4+V*L*
4 series · 4 of 4 positions shown · non-contrast
Comparison: None.

CLINICAL DATA: Left knee pain for 2 months.

EXAM:
LEFT KNEE - COMPLETE 4+ VIEW

[knee ap]
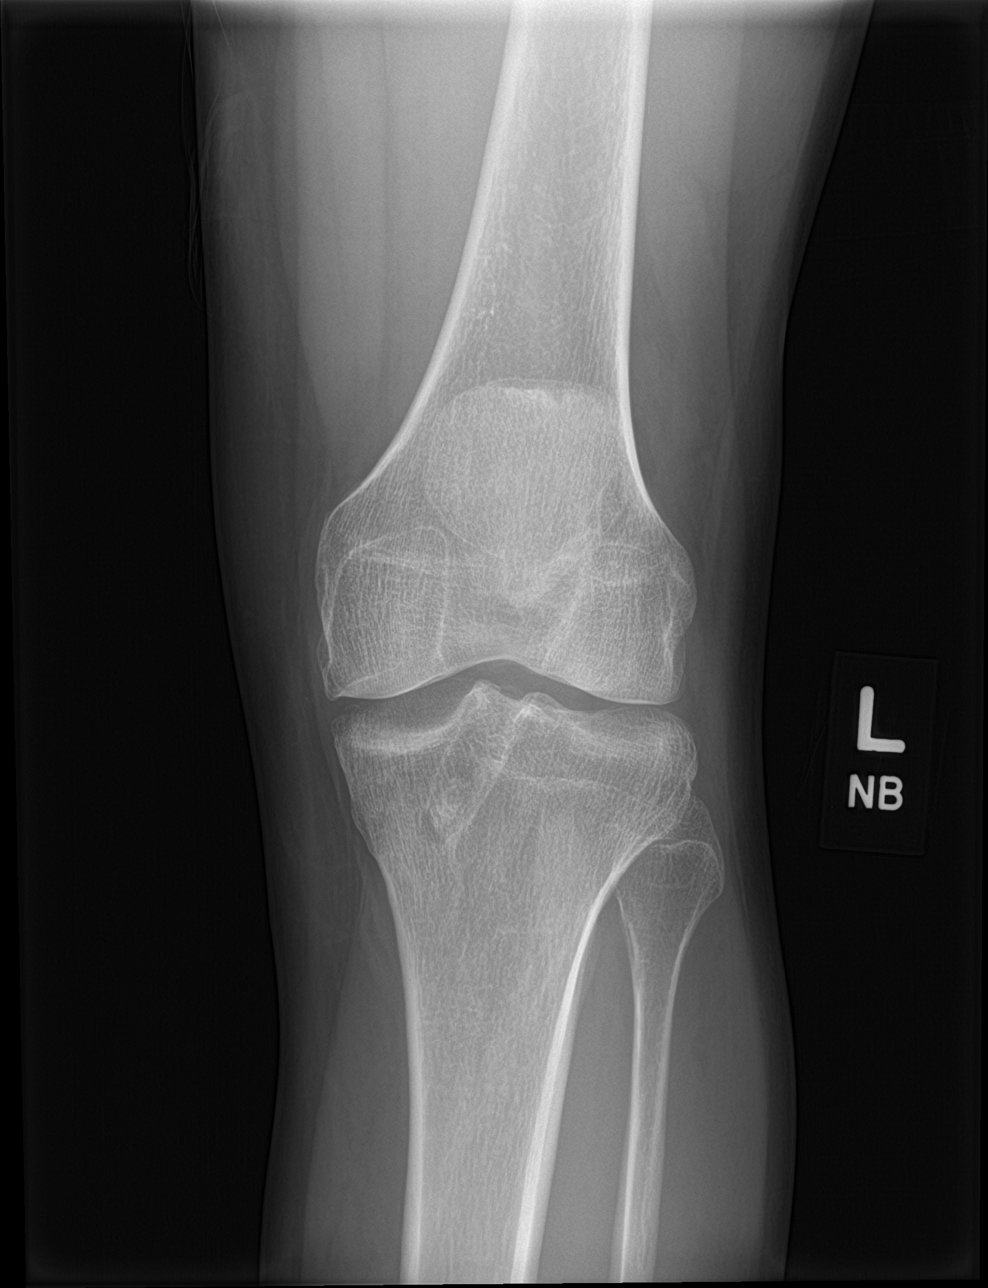

[knee obl (1 of 2)]
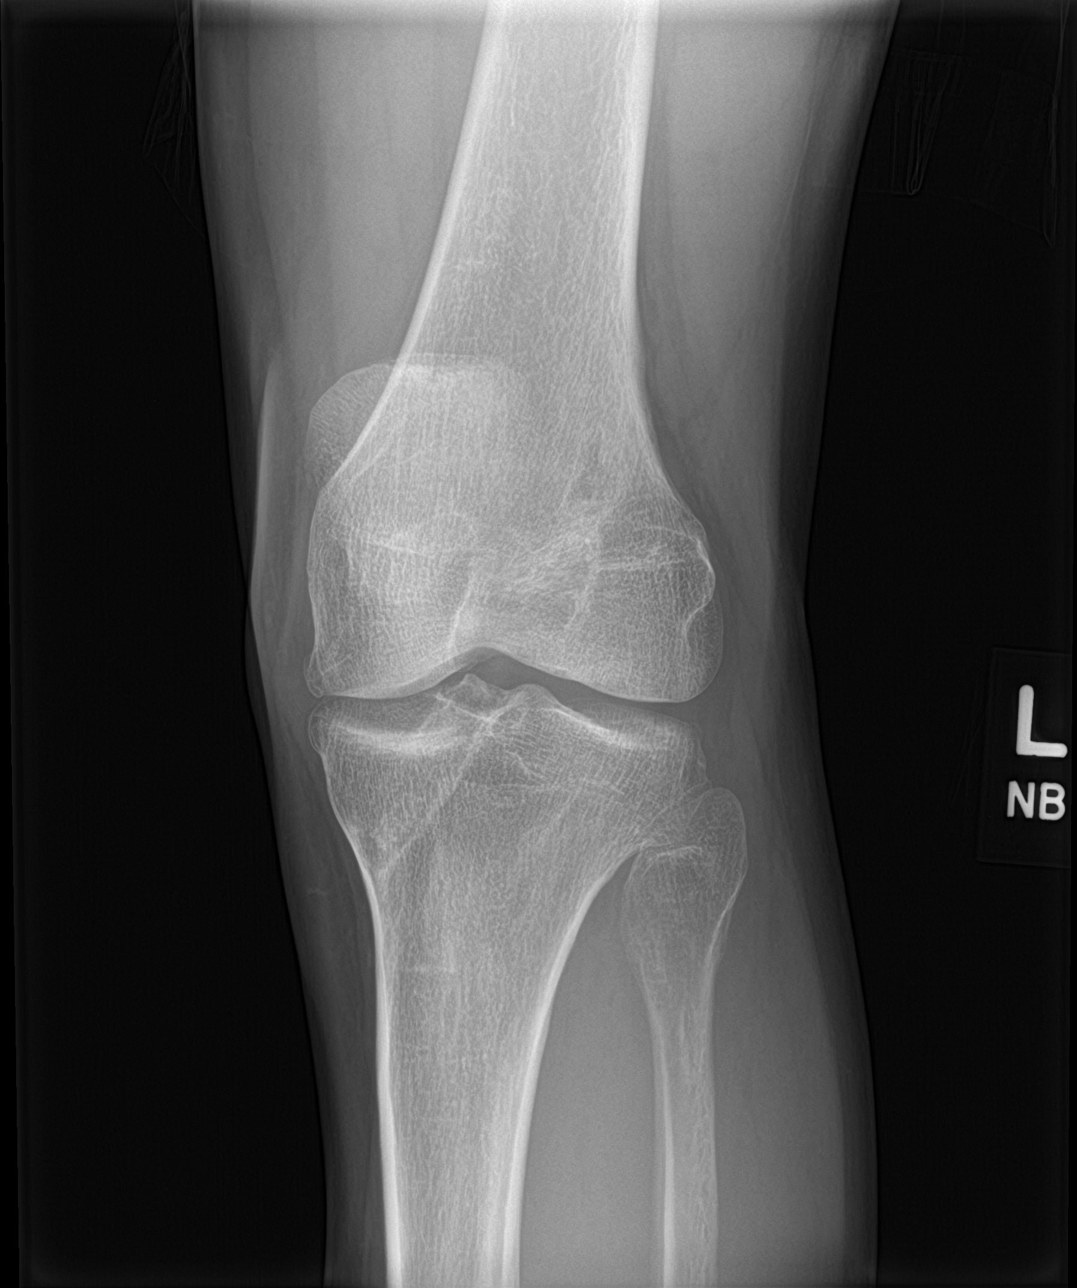

[knee obl (2 of 2)]
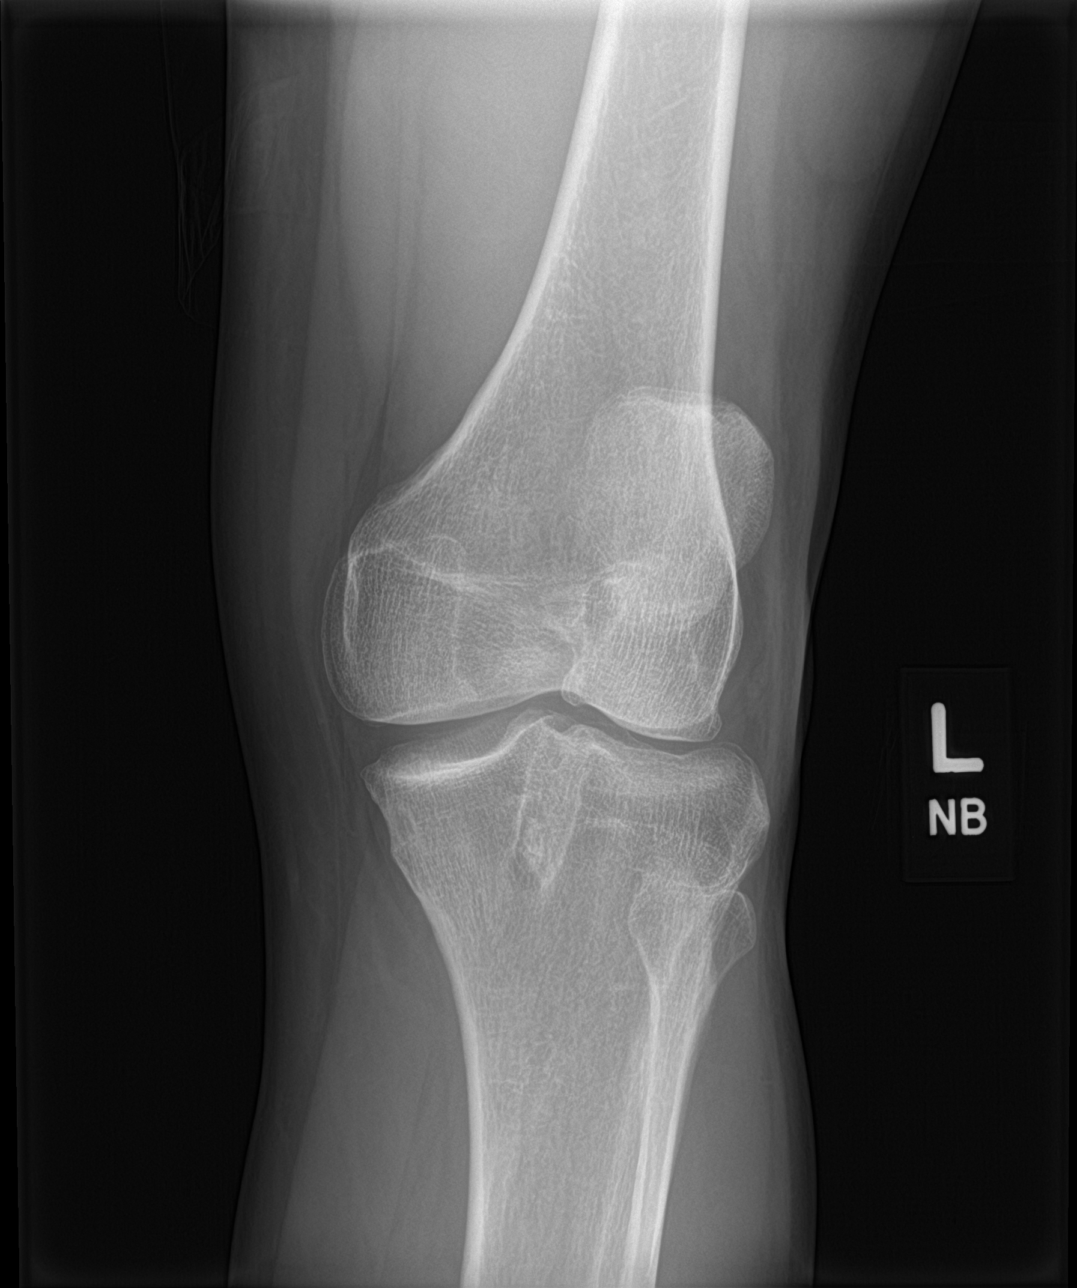

[knee lat]
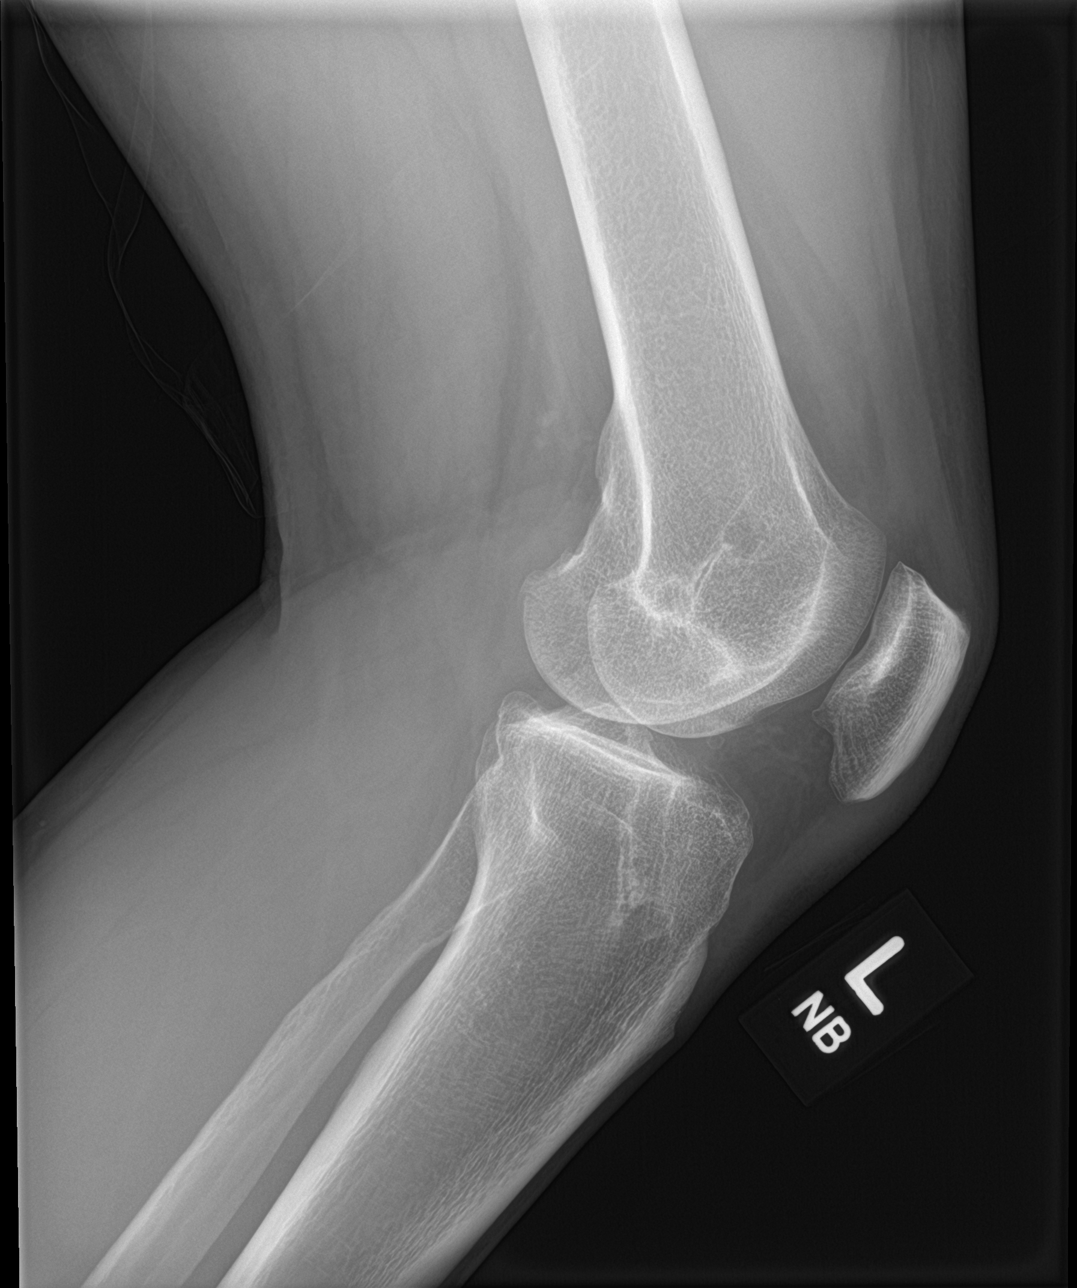

[4 of 4 positions shown; findings below may reference images not displayed]

FINDINGS: No evidence of fracture, dislocation, or joint effusion. No evidence
of arthropathy or other focal bone abnormality. Evidence of prior
ACL repair. No bony lesions.
IMPRESSION: No acute findings.

## 2020-12-30 LAB — COMPLETE METABOLIC PANEL WITH GFR
AG Ratio: 2.2 (calc) (ref 1.0–2.5)
ALT: 12 U/L (ref 9–46)
AST: 15 U/L (ref 10–40)
Albumin: 5.2 g/dL — ABNORMAL HIGH (ref 3.6–5.1)
Alkaline phosphatase (APISO): 74 U/L (ref 36–130)
BUN: 14 mg/dL (ref 7–25)
CO2: 29 mmol/L (ref 20–32)
Calcium: 10.1 mg/dL (ref 8.6–10.3)
Chloride: 101 mmol/L (ref 98–110)
Creat: 1.02 mg/dL (ref 0.60–1.24)
Globulin: 2.4 g/dL (calc) (ref 1.9–3.7)
Glucose, Bld: 93 mg/dL (ref 65–99)
Potassium: 5 mmol/L (ref 3.5–5.3)
Sodium: 140 mmol/L (ref 135–146)
Total Bilirubin: 0.8 mg/dL (ref 0.2–1.2)
Total Protein: 7.6 g/dL (ref 6.1–8.1)
eGFR: 102 mL/min/{1.73_m2} (ref >=60)

## 2020-12-30 LAB — LIPID PANEL
Cholesterol: 204 mg/dL — ABNORMAL HIGH (ref <200)
HDL: 53 mg/dL (ref >=40)
LDL Cholesterol (Calc): 124 mg/dL (calc) — ABNORMAL HIGH
Non-HDL Cholesterol (Calc): 151 mg/dL (calc) — ABNORMAL HIGH (ref <130)
Total CHOL/HDL Ratio: 3.8 (calc) (ref <5.0)
Triglycerides: 159 mg/dL — ABNORMAL HIGH (ref <150)

## 2020-12-30 LAB — CBC
HCT: 46.8 % (ref 38.5–50.0)
Hemoglobin: 16.5 g/dL (ref 13.2–17.1)
MCH: 32 pg (ref 27.0–33.0)
MCHC: 35.3 g/dL (ref 32.0–36.0)
MCV: 90.9 fL (ref 80.0–100.0)
MPV: 11.7 fL (ref 7.5–12.5)
Platelets: 227 10*3/uL (ref 140–400)
RBC: 5.15 10*6/uL (ref 4.20–5.80)
RDW: 11.8 % (ref 11.0–15.0)
WBC: 6.9 10*3/uL (ref 3.8–10.8)

## 2020-12-31 ENCOUNTER — Encounter: Payer: Self-pay | Admitting: Medical-Surgical

## 2020-12-31 DIAGNOSIS — M25362 Other instability, left knee: Secondary | ICD-10-CM

## 2021-01-08 ENCOUNTER — Other Ambulatory Visit: Payer: Self-pay

## 2021-01-08 ENCOUNTER — Ambulatory Visit (INDEPENDENT_AMBULATORY_CARE_PROVIDER_SITE_OTHER): Payer: Commercial Managed Care - PPO

## 2021-01-08 DIAGNOSIS — M25362 Other instability, left knee: Secondary | ICD-10-CM

## 2021-01-08 IMAGING — MR MR KNEE*L* W/O CM
7 series · 40 of 40 positions shown · non-contrast
Comparison: X-ray [DATE]

CLINICAL DATA: Left knee pain and stability for 2 months. History
of 2 prior left knee surgeries

EXAM:
MRI OF THE LEFT KNEE WITHOUT CONTRAST
TECHNIQUE: Multiplanar, multisequence MR imaging of the knee was performed. No
intravenous contrast was administered.

[Series 3: T2 fat-sat · axial · 4.0mm · 0.53mm/px · z∈[-99,+71]mm · 7 of 35 slices shown (1 of 3)]
[im 1/35]
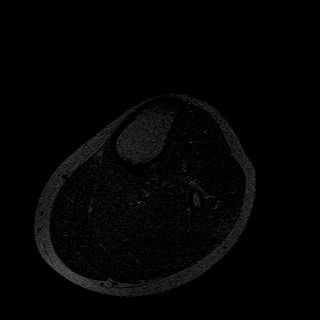
[im 6/35]
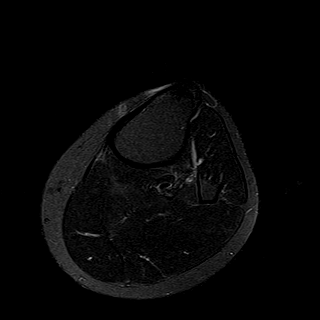
[im 12/35]
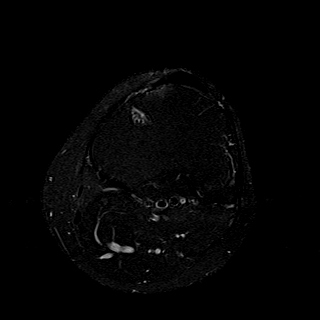
[im 18/35]
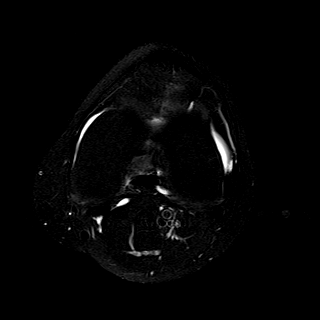
[im 23/35]
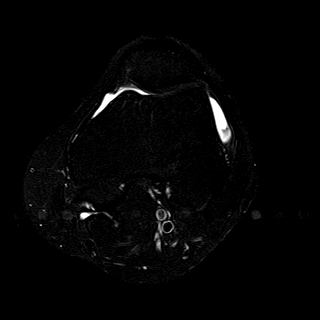
[im 29/35]
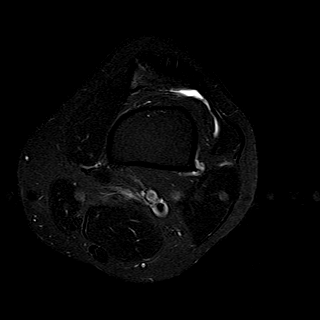
[im 35/35]
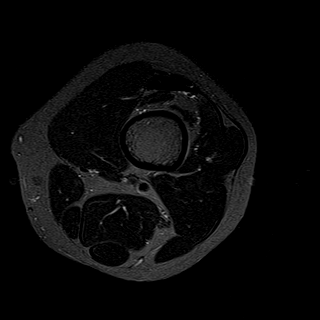

[Series 4: T1 · coronal · 4.0mm · 0.62mm/px · 5 of 27 slices shown]
[im 1/27]
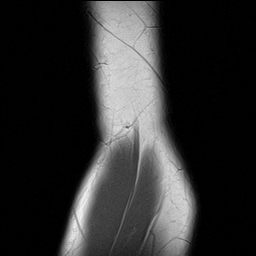
[im 7/27]
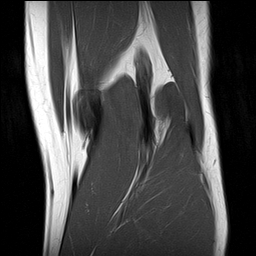
[im 14/27]
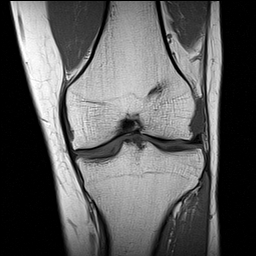
[im 20/27]
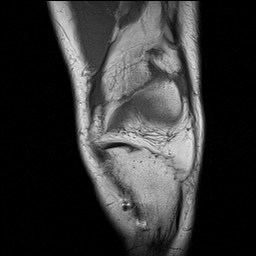
[im 27/27]
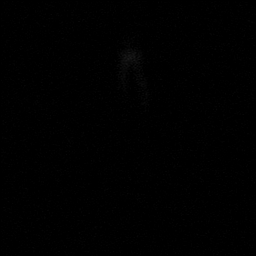

[Series 5: T2 fat-sat · coronal · 4.0mm · 0.62mm/px · 5 of 27 slices shown (2 of 3)]
[im 1/27]
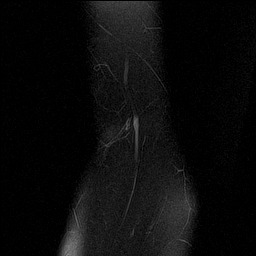
[im 7/27]
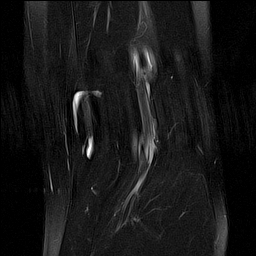
[im 14/27]
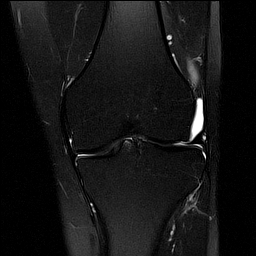
[im 20/27]
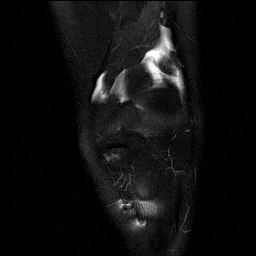
[im 27/27]
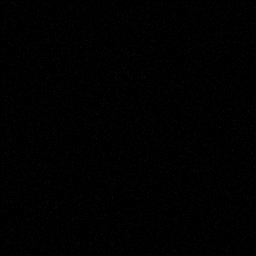

[Series 6: PD fat-sat · coronal · 4.0mm · 0.62mm/px · 5 of 27 slices shown (1 of 3)]
[im 1/27]
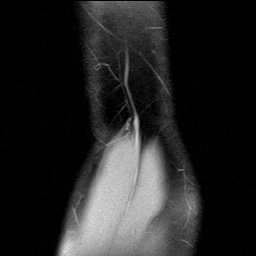
[im 7/27]
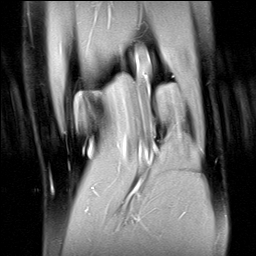
[im 14/27]
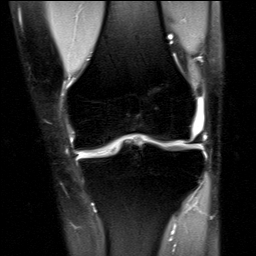
[im 20/27]
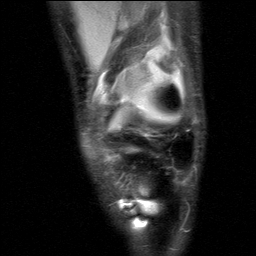
[im 27/27]
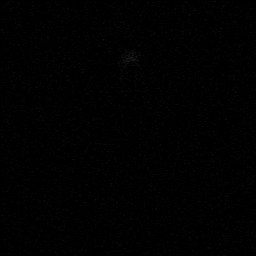

[Series 7: PD fat-sat · sagittal · 3.0mm · 0.62mm/px · 7 of 33 slices shown (2 of 3)]
[im 1/33]
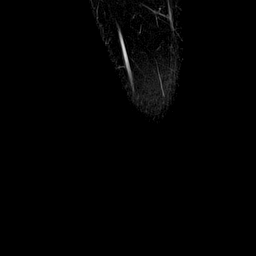
[im 6/33]
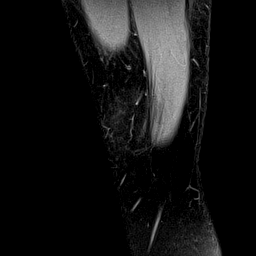
[im 11/33]
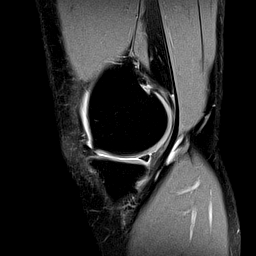
[im 17/33]
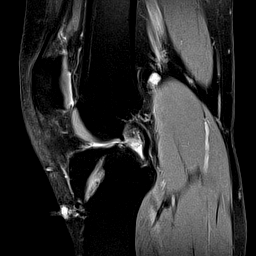
[im 22/33]
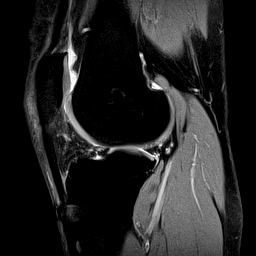
[im 27/33]
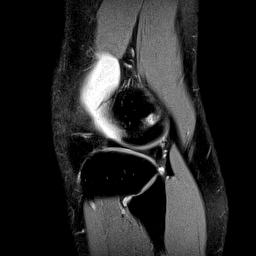
[im 33/33]
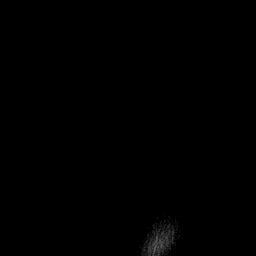

[Series 8: T2 fat-sat · sagittal · 3.0mm · 0.62mm/px · 7 of 33 slices shown (3 of 3)]
[im 1/33]
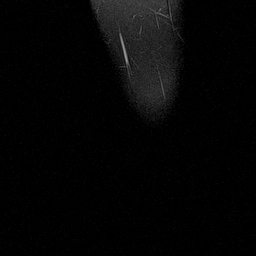
[im 6/33]
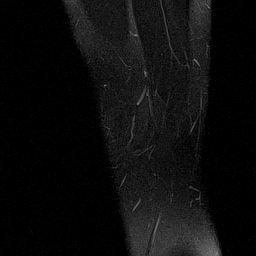
[im 11/33]
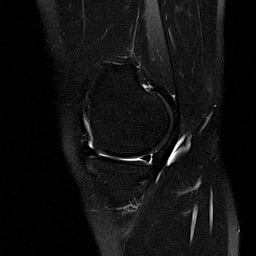
[im 17/33]
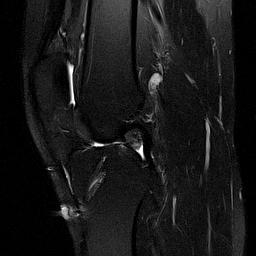
[im 22/33]
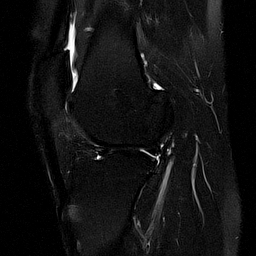
[im 27/33]
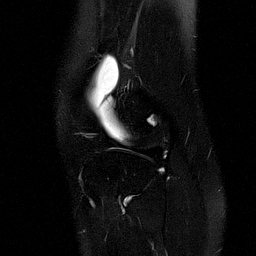
[im 33/33]
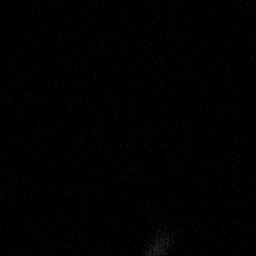

[Series 9: PD fat-sat · coronal · 2.0mm · 0.62mm/px · 4 of 19 slices shown (3 of 3)]
[im 1/19]
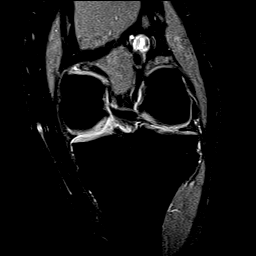
[im 7/19]
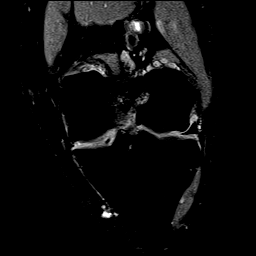
[im 13/19]
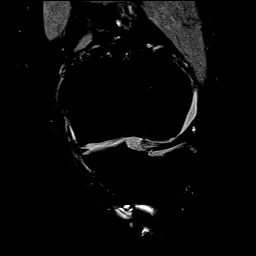
[im 19/19]
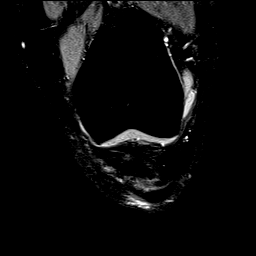

[40 of 40 positions shown; findings below may reference images not displayed]

FINDINGS: MENISCI

Medial meniscus: Focal horizontal-oblique tear of the mid body
(series 6, image 16). Medial meniscus is diminutive in appearance
which may reflect prior partial meniscectomy changes.

Lateral meniscus: Vertical fluid signal within the far peripheral
aspect of the lateral meniscal posterior horn suspicious for tear
(series 7, images 24-26).

LIGAMENTS

Cruciates: Prior ACL reconstruction with completely disrupted graft.
Intact PCL.

Collaterals: Medial collateral ligament is intact. Lateral
collateral ligament complex is intact.

CARTILAGE

Patellofemoral:  No chondral defect.

Medial:  No chondral defect.

Lateral: Mild chondral thinning of the weight-bearing lateral
compartment.

Joint: Small knee joint effusion. No edema or focal scarring within
Hoffa's fat.

Popliteal Fossa:  Tiny Baker's cyst.  Intact popliteus tendon.

Extensor Mechanism:  Intact quadriceps tendon and patellar tendon.

Bones: Prior ACL reconstruction. No cyst formation or other
abnormality within the tibial or femoral tunnels. No fracture. No
suspicious bone lesion. Slight anterior translation of the tibia
relative to the distal femur.

Other: No soft tissue edema or fluid collection.
IMPRESSION: 1. Prior ACL reconstruction with completely disrupted graft.
2. Suspected vertical tear of the peripheral aspect of the lateral
meniscal posterior horn.
3. Focal horizontal-oblique tear of the mid body of the medial
meniscus. Medial meniscus is diminutive in appearance which may
reflect prior partial meniscectomy changes.
4. Small knee joint effusion and tiny Baker's cyst.

## 2021-01-09 ENCOUNTER — Encounter: Payer: Self-pay | Admitting: Medical-Surgical

## 2021-02-28 ENCOUNTER — Ambulatory Visit: Payer: Self-pay

## 2021-03-01 ENCOUNTER — Other Ambulatory Visit: Payer: Self-pay

## 2021-03-01 ENCOUNTER — Emergency Department (INDEPENDENT_AMBULATORY_CARE_PROVIDER_SITE_OTHER)
Admission: RE | Admit: 2021-03-01 | Discharge: 2021-03-01 | Disposition: A | Discharge status: Home / Self Care | Payer: Commercial Managed Care - PPO | Source: Ambulatory Visit

## 2021-03-01 VITALS — BP 129/84 | HR 87 | Temp 99.6°F | Resp 17

## 2021-03-01 DIAGNOSIS — R509 Fever, unspecified: Secondary | ICD-10-CM | POA: Diagnosis not present

## 2021-03-01 DIAGNOSIS — B349 Viral infection, unspecified: Secondary | ICD-10-CM | POA: Diagnosis not present

## 2021-03-01 NOTE — Discharge Instructions (Addendum)
Advised patient conservative measurements for now may alternate between OTC Tylenol 1000 mg 1-2 times daily, as needed with OTC Ibuprofen 600 to 800 mg 1-2 times daily, as needed.  Advised patient we will follow-up with COVID-19/flu A&B results once received.

## 2021-03-01 NOTE — ED Provider Notes (Signed)
[facility]    CSN: [id number] Arrival date & time: 03/01/21  0850      History   Chief Complaint Chief Complaint  Patient presents with  . Cough    9am APPT  . Fatigue  . Generalized Body Aches    HPI Etai [place] Zeiders is a 30 y.o. male.   HPI 30 year old male presents with cough, body aches, and fatigue since yesterday.  Reports 7-year-old daughter was sick on Monday.  Reports 102.0 fever yesterday.  Reports two negative COVID test test yesterday and is vaccinated for COVID-19.  History reviewed. No pertinent past medical history.  There are no problems to display for this patient.   Past Surgical History:  Procedure Laterality Date  . KNEE SURGERY         Home Medications    Prior to Admission medications   Medication Sig Start Date End Date Taking? Authorizing Provider  aspirin-acetaminophen-caffeine (EXCEDRIN MIGRAINE) [id number] MG tablet Take 1 tablet by mouth every 6 (six) hours as needed for headache.    [provider]  diphenhydrAMINE (BENADRYL) 25 MG tablet Take 25 mg by mouth every 6 (six) hours as needed for allergies.    [provider]  ibuprofen (ADVIL,MOTRIN) 200 MG tablet Take 400 mg by mouth every 6 (six) hours as needed for headache.    [provider]  meclizine (ANTIVERT) 12.5 MG tablet Take 1 tablet (12.5 mg total) by mouth 3 (three) times daily as needed for dizziness. Patient not taking: Reported on 12/27/2020 09/21/15   Jacalyn Lefevre, MD  naproxen sodium (ANAPROX) 220 MG tablet Take 220 mg by mouth 2 (two) times daily as needed (headache).    [provider]    Family History History reviewed. No pertinent family history.  Social History Social History   Tobacco Use  . Smoking status: Never  . Smokeless tobacco: Never  Substance Use Topics  . Alcohol use: Yes     Allergies   Patient has no known allergies.   Review of Systems Review of Systems  Constitutional:  Positive  for chills, fatigue and fever.  Respiratory:  Positive for cough.   All other systems reviewed and are negative.   Physical Exam Triage Vital Signs ED Triage Vitals [03/01/21 0903]  Enc Vitals Group     BP 129/84     Pulse Rate 87     Resp 17     Temp 99.6 F (37.6 C)     Temp Source Oral     SpO2 100 %     Weight      Height      Head Circumference      Peak Flow      Pain Score 0     Pain Loc      Pain Edu?      Excl. in GC?    No data found.  Updated Vital Signs BP 129/84 (BP Location: Right Arm)   Pulse 87   Temp 99.6 F (37.6 C) (Oral)   Resp 17   SpO2 100%       Physical Exam Vitals and nursing note reviewed.  Constitutional:      General: He is not in acute distress.    Appearance: Normal appearance. He is normal weight. He is not ill-appearing.  HENT:     Head: Normocephalic and atraumatic.     Right Ear: Tympanic membrane, ear canal and external ear normal.     Left Ear: Tympanic membrane, ear canal  and external ear normal.     Mouth/Throat:     Mouth: Mucous membranes are moist.     Pharynx: Oropharynx is clear.  Eyes:     Extraocular Movements: Extraocular movements intact.     Conjunctiva/sclera: Conjunctivae normal.     Pupils: Pupils are equal, round, and reactive to light.  Cardiovascular:     Rate and Rhythm: Normal rate and regular rhythm.     Pulses: Normal pulses.     Heart sounds: Normal heart sounds.  Pulmonary:     Effort: Pulmonary effort is normal.     Breath sounds: Normal breath sounds.     Comments: No adventitious breath sounds noted Musculoskeletal:        General: Normal range of motion.     Cervical back: Normal range of motion and neck supple. No tenderness.  Lymphadenopathy:     Cervical: No cervical adenopathy.  Skin:    General: Skin is warm and dry.  Neurological:     General: No focal deficit present.     Mental Status: He is alert and oriented to person, place, and time. Mental status is at baseline.   Psychiatric:        Mood and Affect: Mood normal.        Behavior: Behavior normal.        Thought Content: Thought content normal.     UC Treatments / Results  Labs (all labs ordered are listed, but only abnormal results are displayed) Labs Reviewed  COVID-19, FLU A+B NAA    EKG   Radiology No results found.  Procedures Procedures (including critical care time)  Medications Ordered in UC Medications - No data to display  Initial Impression / Assessment and Plan / UC Course  I have reviewed the triage vital signs and the nursing notes.  Pertinent labs & imaging results that were available during my care of the patient were reviewed by me and considered in my medical decision making (see chart for details).     MDM:: 1. Fever-Advised patient conservative measurements for now may alternate between OTC Tylenol 1000 mg 1-2 times daily, as needed with OTC Ibuprofen 600 to 800 mg 1-2 times daily, as needed. 2. Viral Illness-COVID-19/flu A&B ordered.  Advised patient we will follow-up with COVID-19/flu A&B results once received.  Discharged home, hemodynamically stable. Final Clinical Impressions(s) / UC Diagnoses   Final diagnoses:  Viral illness  Fever, unspecified     Discharge Instructions      Advised patient conservative measurements for now may alternate between OTC Tylenol 1000 mg 1-2 times daily, as needed with OTC Ibuprofen 600 to 800 mg 1-2 times daily, as needed.  Advised patient we will follow-up with COVID-19/flu A&B results once received.     ED Prescriptions   None    PDMP not reviewed this encounter.   Trevor Iha, FNP 03/01/21 1015

## 2021-03-01 NOTE — ED Triage Notes (Addendum)
Pt c/o cough, bodyaches and fatigue since yesterday. 30 y/o Daughter was sick since Monday. 102 fever yesterday. 2 neg covid tests yesterday. Tylenol prn. 1 Moderna vaccine.

## 2021-03-03 LAB — COVID-19, FLU A+B NAA
Influenza A, NAA: NOT DETECTED
Influenza B, NAA: NOT DETECTED
SARS-CoV-2, NAA: NOT DETECTED

## 2021-05-02 ENCOUNTER — Encounter: Payer: Self-pay | Admitting: Medical-Surgical

## 2021-05-02 ENCOUNTER — Ambulatory Visit (INDEPENDENT_AMBULATORY_CARE_PROVIDER_SITE_OTHER): Payer: Commercial Managed Care - PPO | Admitting: Family Medicine

## 2021-05-02 ENCOUNTER — Encounter: Payer: Self-pay | Admitting: Family Medicine

## 2021-05-02 ENCOUNTER — Ambulatory Visit (INDEPENDENT_AMBULATORY_CARE_PROVIDER_SITE_OTHER): Payer: Commercial Managed Care - PPO

## 2021-05-02 ENCOUNTER — Other Ambulatory Visit: Payer: Self-pay

## 2021-05-02 VITALS — BP 129/81 | HR 79 | Temp 97.8°F | Wt 179.0 lb

## 2021-05-02 DIAGNOSIS — M545 Low back pain, unspecified: Secondary | ICD-10-CM | POA: Diagnosis not present

## 2021-05-02 DIAGNOSIS — M546 Pain in thoracic spine: Secondary | ICD-10-CM

## 2021-05-02 DIAGNOSIS — Z23 Encounter for immunization: Secondary | ICD-10-CM

## 2021-05-02 DIAGNOSIS — R040 Epistaxis: Secondary | ICD-10-CM | POA: Diagnosis not present

## 2021-05-02 IMAGING — DX DG LUMBAR SPINE COMPLETE 4+V
5 series · 5 of 5 positions shown · non-contrast
Comparison: None.

CLINICAL DATA: Acute bilateral low back pain without sciatica.

EXAM:
LUMBAR SPINE - COMPLETE 4+ VIEW

[l-spine ap]
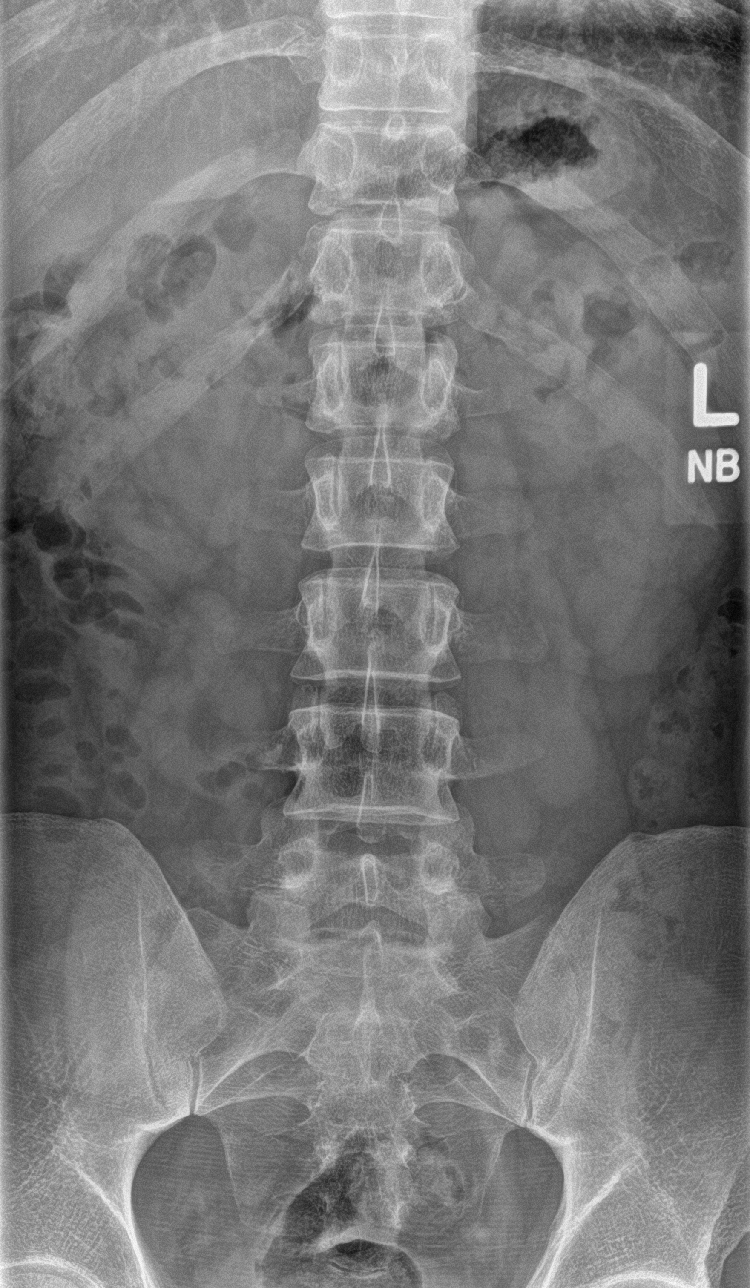

[l-spine obl (1 of 2)]
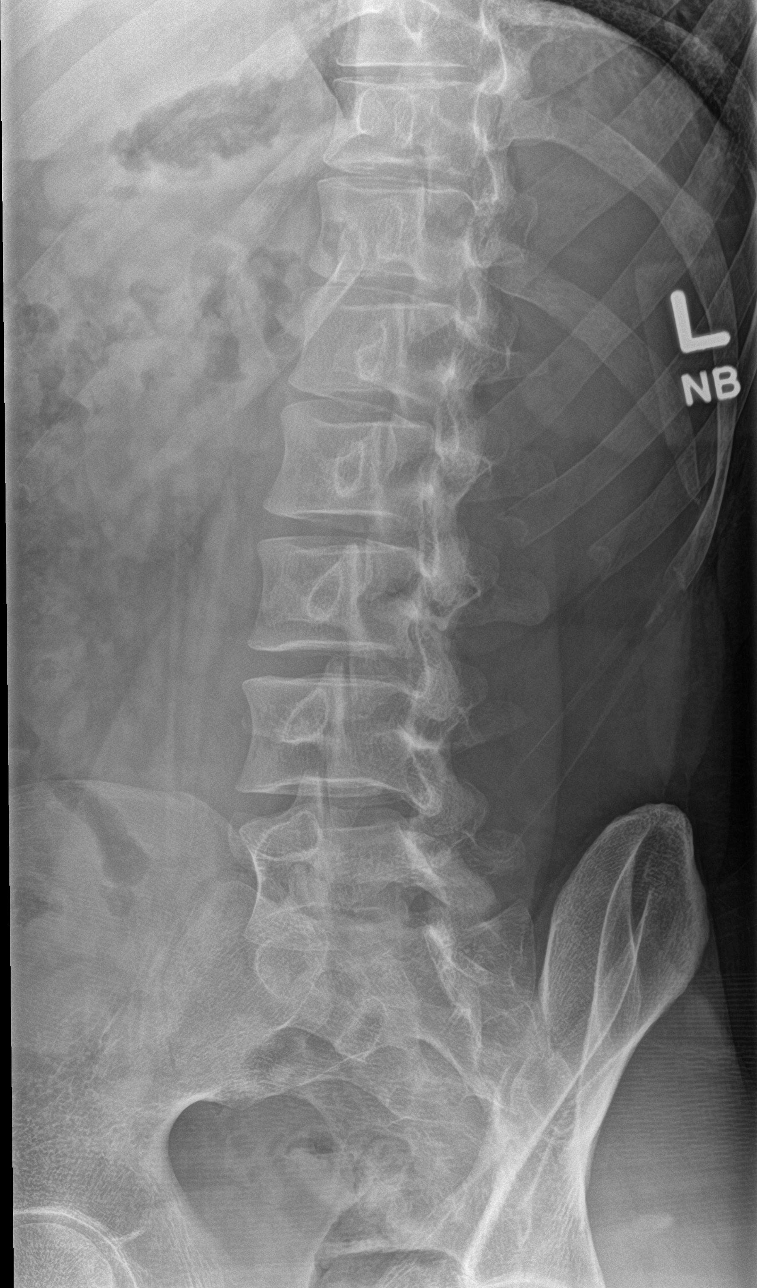

[l-spine obl (2 of 2)]
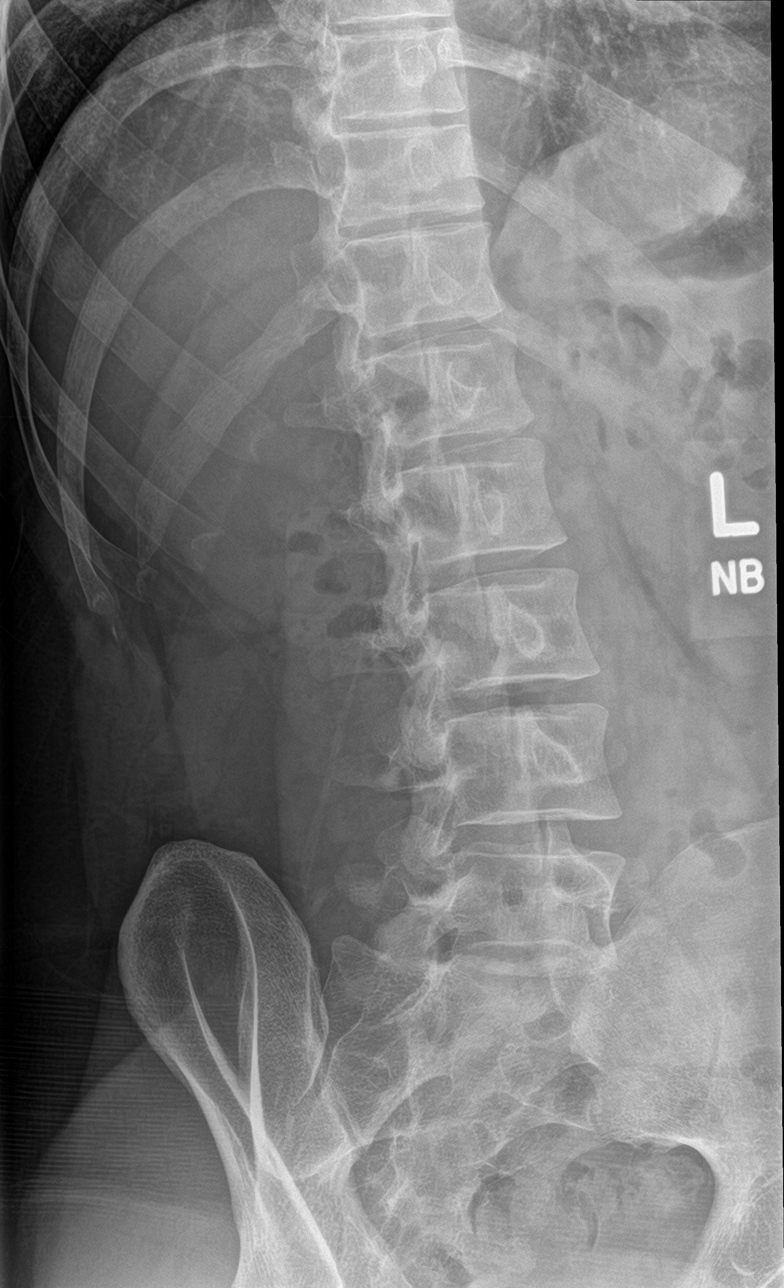

[l-spine lat]
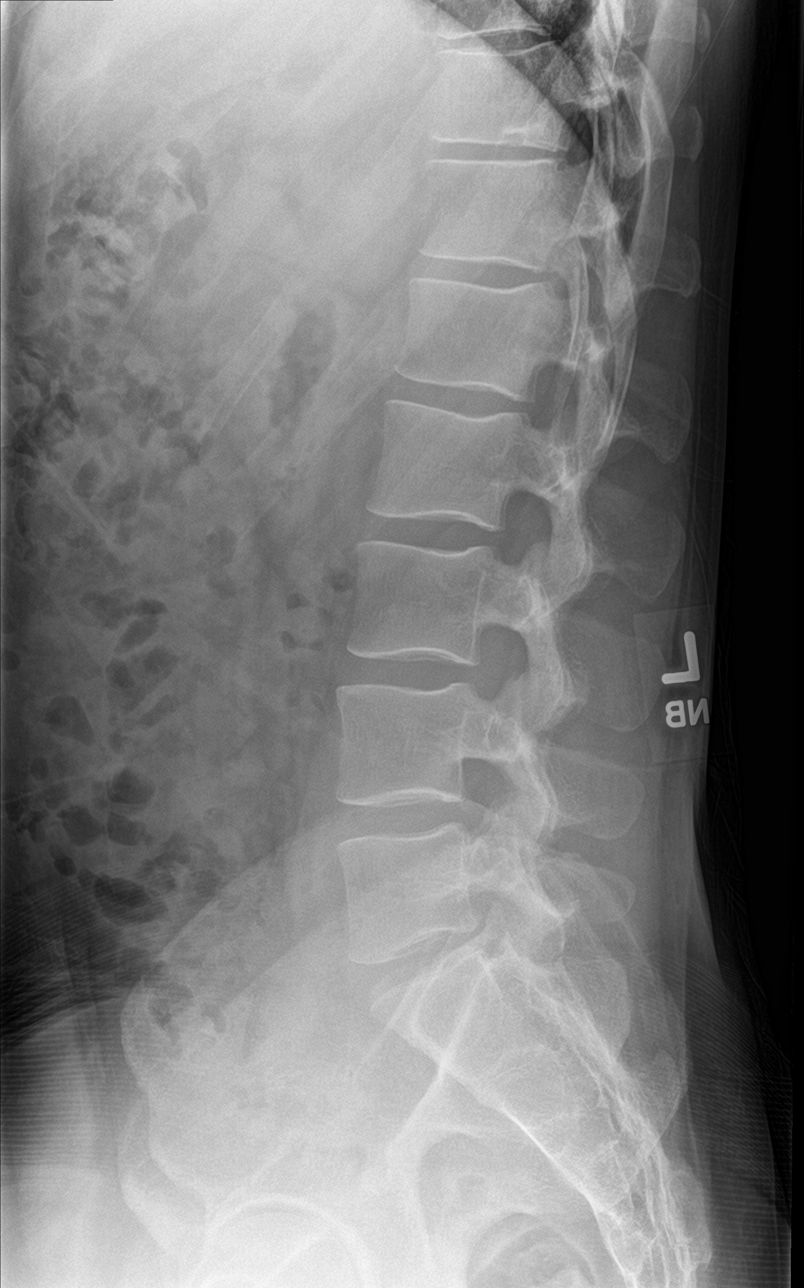

[l-spine spot]
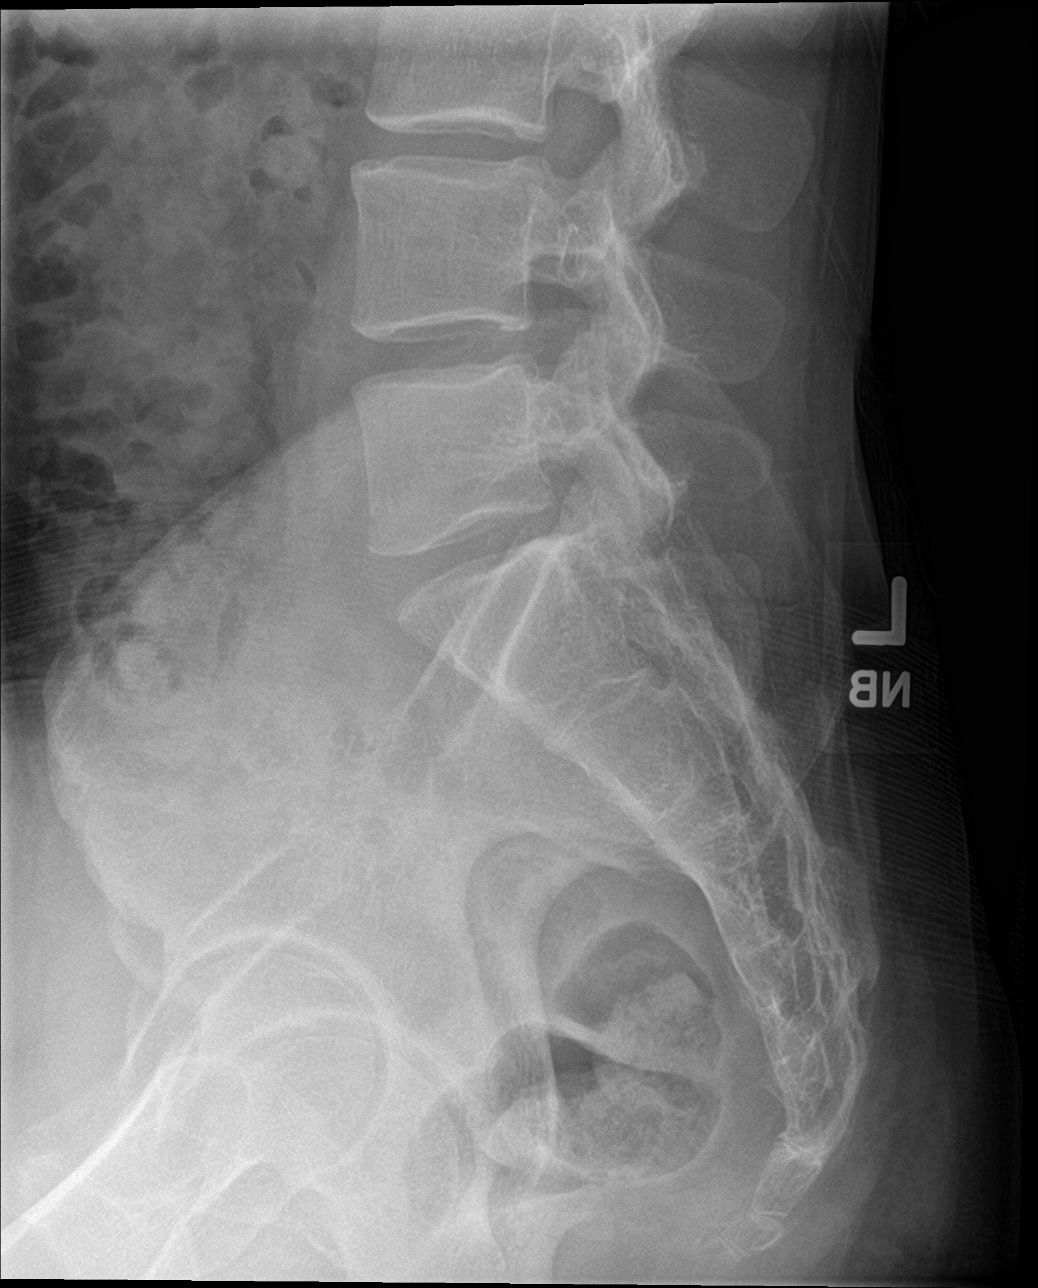

[5 of 5 positions shown; findings below may reference images not displayed]

FINDINGS: Five lumbar type vertebral bodies. No evidence of pars break.
Vertebral body heights and alignment are maintained. There is no
substantial disc space narrowing or facet hypertrophy.
IMPRESSION: Negative.

## 2021-05-02 IMAGING — DX DG THORACIC SPINE 2V
3 series · 3 of 3 positions shown · non-contrast
Comparison: None.

CLINICAL DATA: Mid back pain.  Acute bilateral thoracic back pain.

EXAM:
THORACIC SPINE 2 VIEWS

[t-spine ap]
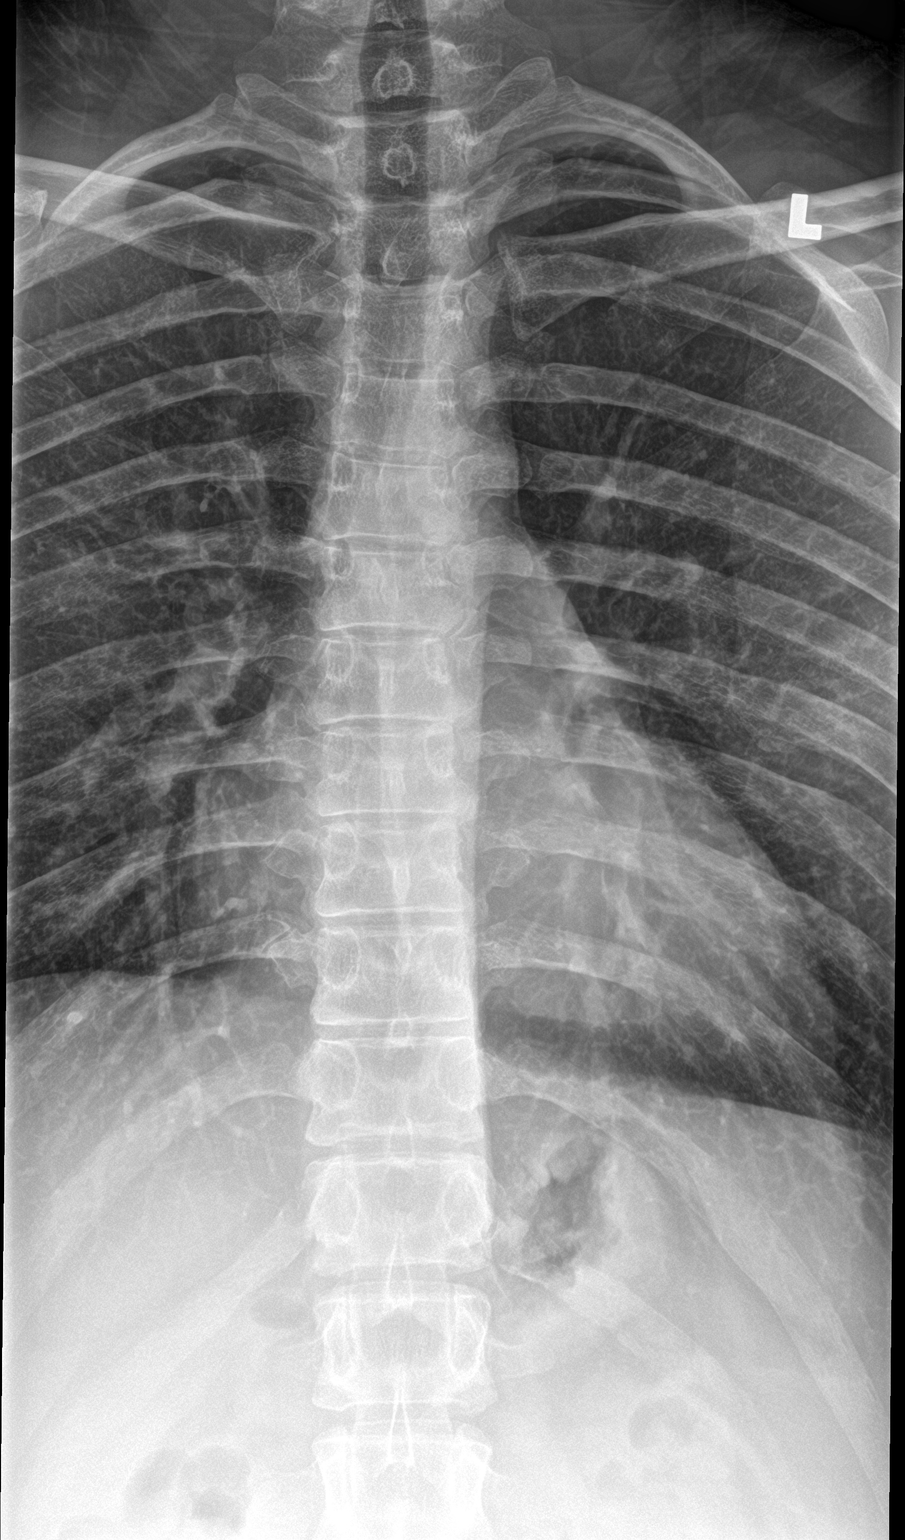

[t-spine lat]
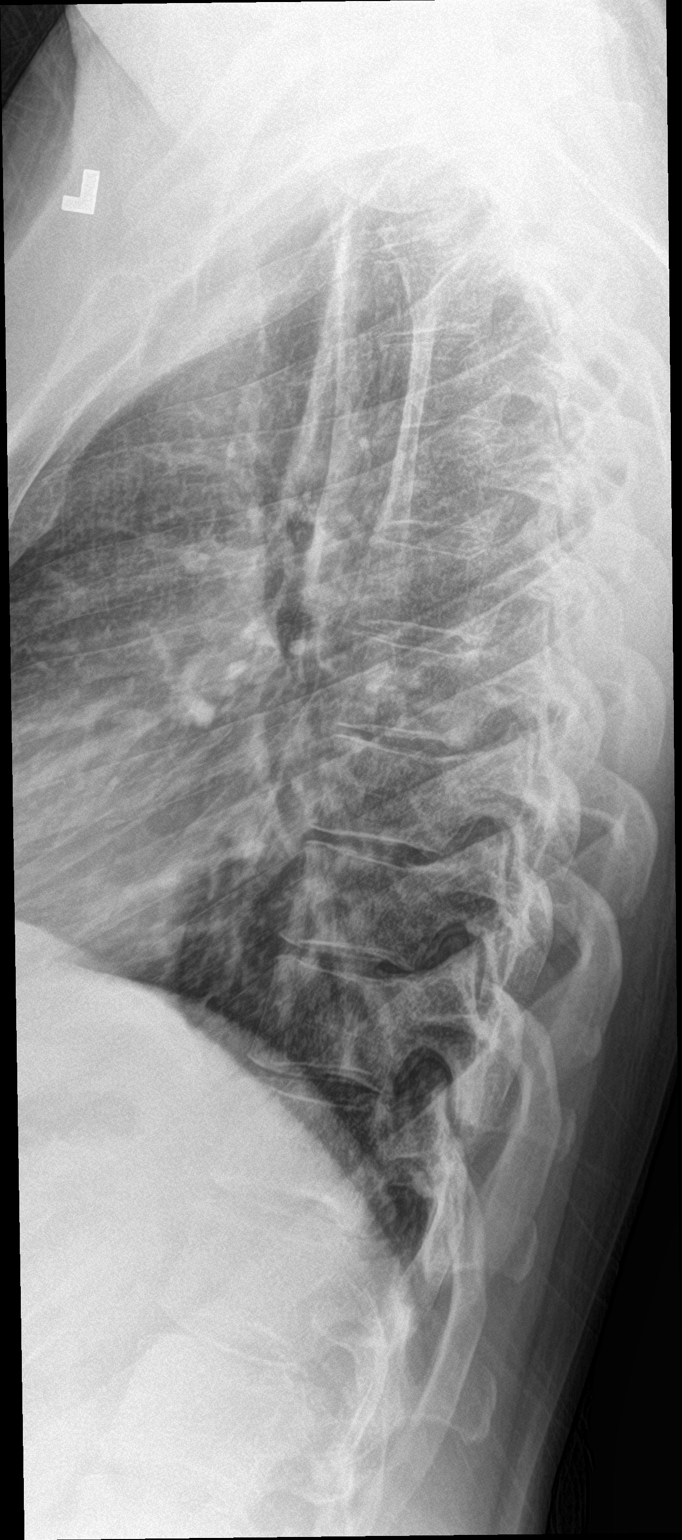

[t-spine swimmers]
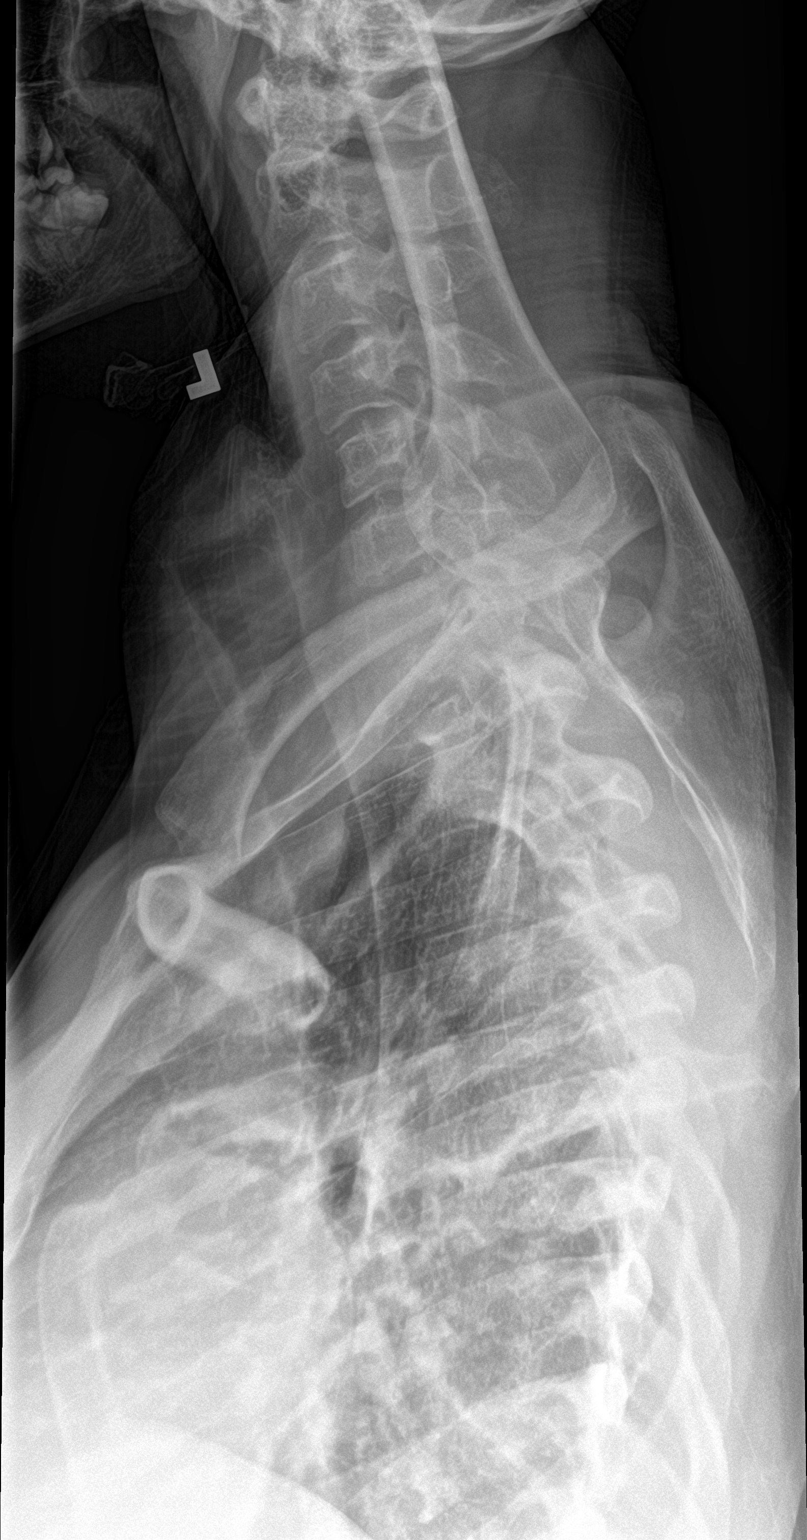

[3 of 3 positions shown; findings below may reference images not displayed]

FINDINGS: There are 12 rib-bearing thoracic vertebra. The alignment is
maintained. Vertebral body heights are maintained. No visualized
fracture or focal bone abnormality. No significant disc space
narrowing. Posterior elements appear intact. There is no
paravertebral soft tissue abnormality.
IMPRESSION: Negative radiographs of the thoracic spine.

## 2021-05-02 MED ORDER — MELOXICAM 15 MG PO TABS: 15.0000 mg | ORAL_TABLET | Freq: Every day | ORAL | 1 refills | Status: DC

## 2021-05-02 NOTE — Progress Notes (Signed)
Acute Office Visit  Subjective:    Patient ID: Dean Spence, male    DOB: 02/27/1991, 30 y.o.   MRN: [id number]  Chief Complaint  Patient presents with   Back Pain    X- 6 weeks. Golfing accident.    Patient is in today for back pain x 6 weeks.  Patient reports while playing golf about 6 weeks ago he swung harder than he should have and ever since his mid/lower back has been bothering him.  States both sides have discomfort but right side seems to be slightly worse.  Discomfort is described as sore/achiness.  At rest pain is 1-2 out of 10, at worst pain gets up to 4-5 out of 10.  Most painful movements are bending, twisting, sitting for prolonged periods.  States that his back feels better with lying flat.  He has been taking occasional ibuprofen.  He denies any did go see a chiropractor a few weeks ago and reports they took an x-ray but he is not aware of any abnormal findings.  States pain has been worse and more consistent since the chiropractor appointment.  He denies any radiation of pain into his legs, perineal numbness, incontinence, numbness or tingling, fever, loss of range of motion.  Additionally he reports a history of nosebleeds.  States that approximately 8 years ago he was having very frequent nosebleeds and had to have a cauterization.  States that he has done fine up until the past few months he has had multiple nosebleeds again.  He would like an ENT referral.  No bleeding today.   No past medical history on file.  Past Surgical History:  Procedure Laterality Date   KNEE SURGERY      No family history on file.  Social History   Socioeconomic History   Marital status: Married    Spouse name: Not on file   Number of children: Not on file   Years of education: Not on file   Highest education level: Not on file  Occupational History   Occupation: Music therapist  Tobacco Use   Smoking status: Never   Smokeless tobacco: Never  Substance and Sexual  Activity   Alcohol use: Yes   Drug use: Not on file   Sexual activity: Yes    Partners: Female  Other Topics Concern   Not on file  Social History Narrative   Not on file   Social Determinants of Health   Financial Resource Strain: Not on file  Food Insecurity: Not on file  Transportation Needs: Not on file  Physical Activity: Not on file  Stress: Not on file  Social Connections: Not on file  Intimate Partner Violence: Not on file    Outpatient Medications Prior to Visit  Medication Sig Dispense Refill   diphenhydrAMINE (BENADRYL) 25 MG tablet Take 25 mg by mouth every 6 (six) hours as needed for allergies.     aspirin-acetaminophen-caffeine (EXCEDRIN MIGRAINE) 250-250-65 MG tablet Take 1 tablet by mouth every 6 (six) hours as needed for headache.     ibuprofen (ADVIL,MOTRIN) 200 MG tablet Take 400 mg by mouth every 6 (six) hours as needed for headache.     naproxen sodium (ANAPROX) 220 MG tablet Take 220 mg by mouth 2 (two) times daily as needed (headache).     meclizine (ANTIVERT) 12.5 MG tablet Take 1 tablet (12.5 mg total) by mouth 3 (three) times daily as needed for dizziness. (Patient not taking: Reported on 12/27/2020) 30 tablet 0   No  facility-administered medications prior to visit.    No Known Allergies  Review of Systems All review of systems negative except what is listed in the HPI      Objective:    Physical Exam Vitals reviewed.  Constitutional:      Appearance: Normal appearance. He is normal weight.  HENT:     Head: Normocephalic and atraumatic.  Musculoskeletal:        General: Normal range of motion.  Skin:    General: Skin is warm and dry.     Findings: No rash.  Neurological:     General: No focal deficit present.     Mental Status: He is alert and oriented to person, place, and time. Mental status is at baseline.     Cranial Nerves: No cranial nerve deficit.     Motor: No weakness.     Coordination: Coordination normal.     Gait: Gait  normal.  Psychiatric:        Mood and Affect: Mood normal.        Behavior: Behavior normal.        Thought Content: Thought content normal.        Judgment: Judgment normal.  Back Exam:    Inspection:  Normal spinal curvature.  No deformity, ecchymosis, erythema, or lesions     Palpation:     Midline spinal tenderness: no    Paralumbar tenderness: no      Parathoracic tenderness: no      Buttocks tenderness: no     Range of Motion:      Flexion: Fingers to Knees     Extension:Normal     Lateral bending:Normal    Rotation:Normal   BP 129/81 (BP Location: Left Arm, Patient Position: Sitting, Cuff Size: Normal)   Pulse 79   Temp 97.8 F (36.6 C) (Oral)   Wt 179 lb 0.6 oz (81.2 kg)   SpO2 100%   BMI 26.44 kg/m  Wt Readings from Last 3 Encounters:  05/02/21 179 lb 0.6 oz (81.2 kg)  12/27/20 178 lb (80.7 kg)  02/11/20 172 lb (78 kg)    Health Maintenance Due  Topic Date Due   HIV Screening  Never done   Hepatitis C Screening  Never done   TETANUS/TDAP  Never done   COVID-19 Vaccine (3 - Booster for Moderna series) 12/05/2019    There are no preventive care reminders to display for this patient.   No results found for: TSH Lab Results  Component Value Date   WBC 6.9 12/29/2020   HGB 16.5 12/29/2020   HCT 46.8 12/29/2020   MCV 90.9 12/29/2020   PLT 227 12/29/2020   Lab Results  Component Value Date   NA 140 12/29/2020   K 5.0 12/29/2020   CO2 29 12/29/2020   GLUCOSE 93 12/29/2020   BUN 14 12/29/2020   CREATININE 1.02 12/29/2020   BILITOT 0.8 12/29/2020   AST 15 12/29/2020   ALT 12 12/29/2020   PROT 7.6 12/29/2020   CALCIUM 10.1 12/29/2020   EGFR 102 12/29/2020   Lab Results  Component Value Date   CHOL 204 (H) 12/29/2020   Lab Results  Component Value Date   HDL 53 12/29/2020   Lab Results  Component Value Date   LDLCALC 124 (H) 12/29/2020   Lab Results  Component Value Date   TRIG 159 (H) 12/29/2020   Lab Results  Component Value Date    CHOLHDL 3.8 12/29/2020   No results found for: HGBA1C  Assessment & Plan:    1. Need for influenza vaccination - Flu Vaccine QUAD 6+ mos PF IM (Fluarix Quad PF)  2. Epistaxis, recurrent Recommend moisturizing with saline spray, Vaseline or saline gel, humidifier use. - Ambulatory referral to ENT  3. Acute bilateral thoracic back pain 4. Acute bilateral low back pain without sciatica Xrays given duration of pain despite 6 weeks of home management. Starting meloxicam. Home exercises provided. PT referral placed. Patient aware of signs/symptoms requiring further/urgent evaluation.  - DG Lumbar Spine Complete; Future - DG Thoracic Spine 2 View; Future - meloxicam (MOBIC) 15 MG tablet; Take 1 tablet (15 mg total) by mouth daily.  Dispense: 30 tablet; Refill: 1 - Ambulatory referral to Physical Therapy   Follow-up in 4-6 weeks if no improvement or sooner if needed. Will update with xray results.   Purcell Nails Olevia Bowens, DNP, FNP-C

## 2021-05-02 NOTE — Patient Instructions (Signed)
Xrays today Meloxicam once a day (do not take with any other antiinflammatories like aleve, ibuprofen, etc.) PT referral placed Work on home exercises in the meantime ENT referral placed

## 2021-05-31 ENCOUNTER — Encounter: Payer: Self-pay | Admitting: Medical-Surgical

## 2021-06-02 ENCOUNTER — Encounter: Payer: Self-pay | Admitting: Family Medicine

## 2021-06-02 ENCOUNTER — Ambulatory Visit (INDEPENDENT_AMBULATORY_CARE_PROVIDER_SITE_OTHER): Payer: Commercial Managed Care - PPO | Admitting: Family Medicine

## 2021-06-02 ENCOUNTER — Other Ambulatory Visit: Payer: Self-pay

## 2021-06-02 VITALS — BP 129/90 | HR 93 | Temp 98.0°F | Ht 69.0 in | Wt 176.1 lb

## 2021-06-02 DIAGNOSIS — R42 Dizziness and giddiness: Secondary | ICD-10-CM

## 2021-06-02 NOTE — Patient Instructions (Signed)
Try eating smaller meals, more frequently throughout the day if needed.  Increase hydration

## 2021-06-02 NOTE — Progress Notes (Signed)
Acute Office Visit  Subjective:    Patient ID: Dean Spence, male    DOB: 1991-02-06, 30 y.o.   MRN: [id number]  Chief Complaint  Patient presents with   Dizziness    HPI Patient is in today for dizziness.   On Monday, patient decided to do an ice bath for his back pain. He sat in 36F bathtub for 3 minutes, chest and below submerged. States he didn't notice anything initially. About 3 hours later when he went to eat, he felt really dizzy and he check his blood pressure and it was up to 150/115 and stayed high along with elevated heart rate and headache for about 12 hours. Vitals  eventually stabilized, but he has continued to have higher than baseline BP and HR with all meals since then along with the same dizzy sensation. It has also happened a few times with quick position changes, but otherwise only with eating. He describes the dizziness as more of a lightheaded sensation - the first few times were very severe and he felt presyncopal, but the last several have been more mild. He has had some mild nausea with these episodes as well but no vomiting. He denies any spinning sensations or feeling unsteady or off balance. He has had no trouble walking, driving, playing, etc. He denies any vision changes, chest pain, dyspnea.     No past medical history on file.  Past Surgical History:  Procedure Laterality Date   KNEE SURGERY      No family history on file.  Social History   Socioeconomic History   Marital status: Married    Spouse name: Not on file   Number of children: Not on file   Years of education: Not on file   Highest education level: Not on file  Occupational History   Occupation: Music therapist  Tobacco Use   Smoking status: Never   Smokeless tobacco: Never  Substance and Sexual Activity   Alcohol use: Yes   Drug use: Not on file   Sexual activity: Yes    Partners: Female  Other Topics Concern   Not on file  Social History Narrative   Not on file    Social Determinants of Health   Financial Resource Strain: Not on file  Food Insecurity: Not on file  Transportation Needs: Not on file  Physical Activity: Not on file  Stress: Not on file  Social Connections: Not on file  Intimate Partner Violence: Not on file    Outpatient Medications Prior to Visit  Medication Sig Dispense Refill   diphenhydrAMINE (BENADRYL) 25 MG tablet Take 25 mg by mouth every 6 (six) hours as needed for allergies. (Patient not taking: Reported on 06/02/2021)     meloxicam (MOBIC) 15 MG tablet Take 1 tablet (15 mg total) by mouth daily. (Patient not taking: Reported on 06/02/2021) 30 tablet 1   meclizine (ANTIVERT) 12.5 MG tablet Take 1 tablet (12.5 mg total) by mouth 3 (three) times daily as needed for dizziness. (Patient not taking: Reported on 12/27/2020) 30 tablet 0   No facility-administered medications prior to visit.    No Known Allergies  Review of Systems All review of systems negative except what is listed in the HPI     Objective:    Physical Exam Vitals reviewed.  Constitutional:      Appearance: Normal appearance. He is normal weight.  HENT:     Head: Normocephalic and atraumatic.     Comments: Dix-Hallpike reproduced lightheaded sensation, but  no nystagmus    Right Ear: Tympanic membrane normal.     Left Ear: Tympanic membrane normal.     Nose: Nose normal.     Mouth/Throat:     Mouth: Mucous membranes are moist.     Pharynx: Oropharynx is clear.  Eyes:     Extraocular Movements: Extraocular movements intact.     Conjunctiva/sclera: Conjunctivae normal.     Pupils: Pupils are equal, round, and reactive to light.  Cardiovascular:     Rate and Rhythm: Normal rate and regular rhythm.     Pulses: Normal pulses.     Heart sounds: Normal heart sounds.  Pulmonary:     Breath sounds: Normal breath sounds.  Musculoskeletal:     Cervical back: Normal range of motion and neck supple.  Skin:    General: Skin is warm and dry.   Neurological:     General: No focal deficit present.     Mental Status: He is alert and oriented to person, place, and time. Mental status is at baseline.  Psychiatric:        Mood and Affect: Mood normal.        Behavior: Behavior normal.        Thought Content: Thought content normal.        Judgment: Judgment normal.    BP 129/90 (BP Location: Left Arm, Patient Position: Sitting, Cuff Size: Normal)    Pulse 93    Temp 98 F (36.7 C) (Oral)    Ht _0  (1.753 m)    Wt 176 lb 1.9 oz (79.9 kg)    SpO2 99%    BMI 26.01 kg/m  Wt Readings from Last 3 Encounters:  06/02/21 176 lb 1.9 oz (79.9 kg)  05/02/21 179 lb 0.6 oz (81.2 kg)  12/27/20 178 lb (80.7 kg)    Health Maintenance Due  Topic Date Due   HIV Screening  Never done   Hepatitis C Screening  Never done    There are no preventive care reminders to display for this patient.   No results found for: TSH Lab Results  Component Value Date   WBC 6.9 12/29/2020   HGB 16.5 12/29/2020   HCT 46.8 12/29/2020   MCV 90.9 12/29/2020   PLT 227 12/29/2020   Lab Results  Component Value Date   NA 140 12/29/2020   K 5.0 12/29/2020   CO2 29 12/29/2020   GLUCOSE 93 12/29/2020   BUN 14 12/29/2020   CREATININE 1.02 12/29/2020   BILITOT 0.8 12/29/2020   AST 15 12/29/2020   ALT 12 12/29/2020   PROT 7.6 12/29/2020   CALCIUM 10.1 12/29/2020   EGFR 102 12/29/2020   Lab Results  Component Value Date   CHOL 204 (H) 12/29/2020   Lab Results  Component Value Date   HDL 53 12/29/2020   Lab Results  Component Value Date   LDLCALC 124 (H) 12/29/2020   Lab Results  Component Value Date   TRIG 159 (H) 12/29/2020   Lab Results  Component Value Date   CHOLHDL 3.8 12/29/2020   No results found for: HGBA1C     Assessment & Plan:   1. Intermittent lightheadedness Normal exam today. Not orthostatic. Discussed case with Dr. Dianah Field. Consider vascular fluid shift with big meals triggering symptoms following ice bath.  Symptoms are not as severe as the first 2 days. Recommend he continue monitoring symptoms for any worsening or changes. Encouraged him to stay well hydrated and try to eat smaller meals until  symptoms resolve. Patient aware of signs/symptoms requiring further/urgent evaluation.   Follow-up if symptoms worsen or fail to improve.   Dean Nails Olevia Bowens, DNP, FNP-C

## 2021-06-13 ENCOUNTER — Other Ambulatory Visit: Payer: Self-pay

## 2021-06-13 ENCOUNTER — Ambulatory Visit (INDEPENDENT_AMBULATORY_CARE_PROVIDER_SITE_OTHER): Payer: Commercial Managed Care - PPO | Admitting: Sports Medicine

## 2021-06-13 DIAGNOSIS — H8113 Benign paroxysmal vertigo, bilateral: Secondary | ICD-10-CM | POA: Diagnosis not present

## 2021-06-13 DIAGNOSIS — M5412 Radiculopathy, cervical region: Secondary | ICD-10-CM | POA: Diagnosis not present

## 2021-06-13 DIAGNOSIS — M542 Cervicalgia: Secondary | ICD-10-CM | POA: Diagnosis not present

## 2021-06-13 DIAGNOSIS — H8110 Benign paroxysmal vertigo, unspecified ear: Secondary | ICD-10-CM | POA: Insufficient documentation

## 2021-06-13 DIAGNOSIS — M545 Low back pain, unspecified: Secondary | ICD-10-CM | POA: Diagnosis not present

## 2021-06-13 DIAGNOSIS — G8929 Other chronic pain: Secondary | ICD-10-CM

## 2021-06-13 NOTE — Assessment & Plan Note (Signed)
[place] has also had over 3 months of low back pain, axial, worse when trying to play golf, going from bending to standing. Nothing overtly radicular, no red flag symptoms. He has seen a chiropractor as well with only minimal improvements, x-rays unrevealing. Meloxicam ineffective, at this point he is failed greater than 6 weeks of physician directed conservative treatment, proceeding with MRI lumbar spine. He will do some additional physical therapy with [facility] in the meantime

## 2021-06-13 NOTE — Progress Notes (Signed)
° ° °  Procedures performed today:    None.  Independent interpretation of notes and tests performed by another provider:   None.  Brief History, Exam, Impression, and Recommendations:    Benign positional vertigo Unclear etiology, he needs to follow this up with a new PCP, I will ask his physical therapist at [facility] to try some vestibular rehab in the meantime.  Chronic low back pain [place] has also had over 3 months of low back pain, axial, worse when trying to play golf, going from bending to standing. Nothing overtly radicular, no red flag symptoms. He has seen a chiropractor as well with only minimal improvements, x-rays unrevealing. Meloxicam ineffective, at this point he is failed greater than 6 weeks of physician directed conservative treatment, proceeding with MRI lumbar spine. He will do some additional physical therapy with [facility] in the meantime  Radiculitis of right cervical region Has also had neck pain for over 3 to 4 months now after session of golf, axial neck pain with radiation to the right periscapular region, nothing overtly radicular down to the hand or fingertips. Had x-rays within the past month or 2 at his chiropractor, proceeding with MRI due to failure of conservative treatment for over 6 weeks.    ___________________________________________ Ihor Austin. Benjamin Stain, M.D., ABFM., CAQSM. Primary Care and Sports Medicine [facility]  Adjunct Instructor of Family Medicine  [employer]

## 2021-06-13 NOTE — Assessment & Plan Note (Signed)
Has also had neck pain for over 3 to 4 months now after session of golf, axial neck pain with radiation to the right periscapular region, nothing overtly radicular down to the hand or fingertips. Had x-rays within the past month or 2 at his chiropractor, proceeding with MRI due to failure of conservative treatment for over 6 weeks.

## 2021-06-13 NOTE — Assessment & Plan Note (Signed)
Unclear etiology, he needs to follow this up with a new PCP, I will ask his physical therapist at [facility] to try some vestibular rehab in the meantime.

## 2021-06-17 ENCOUNTER — Other Ambulatory Visit: Payer: Self-pay

## 2021-06-17 ENCOUNTER — Ambulatory Visit (INDEPENDENT_AMBULATORY_CARE_PROVIDER_SITE_OTHER): Payer: Commercial Managed Care - PPO

## 2021-06-17 DIAGNOSIS — M545 Low back pain, unspecified: Secondary | ICD-10-CM | POA: Diagnosis not present

## 2021-06-17 DIAGNOSIS — M542 Cervicalgia: Secondary | ICD-10-CM | POA: Diagnosis not present

## 2021-06-17 DIAGNOSIS — M5412 Radiculopathy, cervical region: Secondary | ICD-10-CM | POA: Diagnosis not present

## 2021-06-17 DIAGNOSIS — G8929 Other chronic pain: Secondary | ICD-10-CM

## 2021-06-17 IMAGING — MR MR CERVICAL SPINE W/O CM
5 series · 42 of 48 positions shown · non-contrast
Comparison: None.

CLINICAL DATA: Neck pain, chronic; right cervical radiculitis

EXAM:
MRI CERVICAL SPINE WITHOUT CONTRAST
TECHNIQUE: Multiplanar, multisequence MR imaging of the cervical spine was
performed. No intravenous contrast was administered.

[Series 2: T2 · sagittal · 3.0mm · 0.69mm/px · 6 of 15 slices shown (1 of 2)]
[im 1/15]
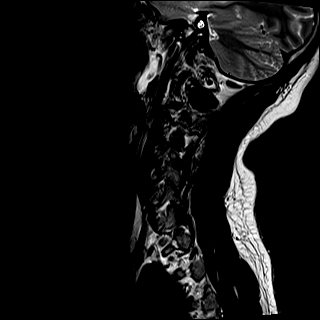
[im 3/15]
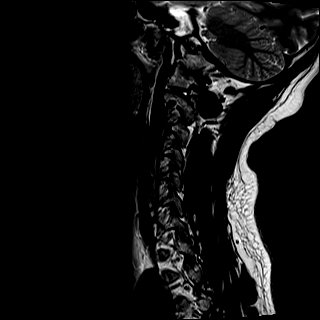
[im 6/15]
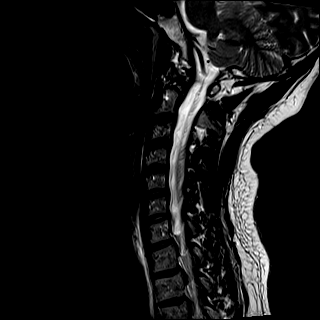
[im 9/15]
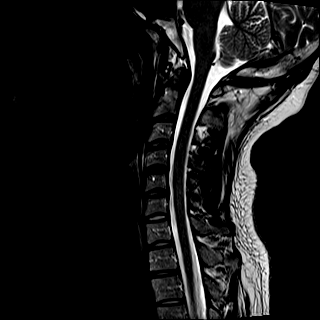
[im 12/15]
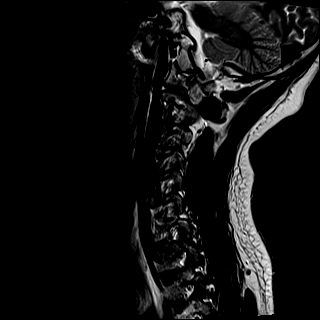
[im 15/15]
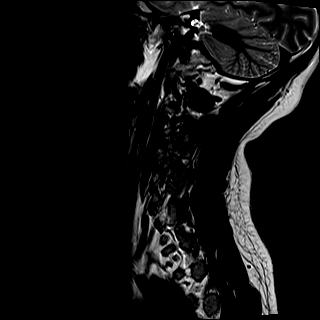

[Series 3: T1 · sagittal · 3.0mm · 0.86mm/px · 7 of 15 slices shown]
[im 1/15]
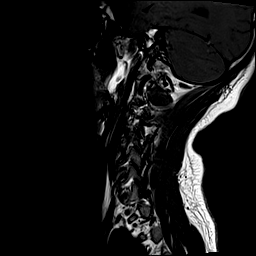
[im 3/15]
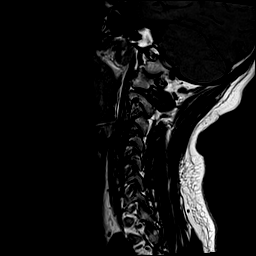
[im 5/15]
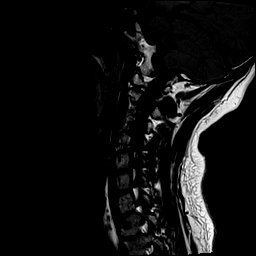
[im 8/15]
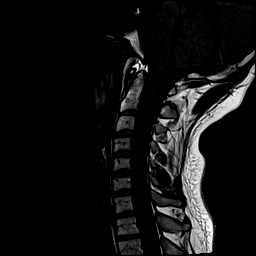
[im 10/15]
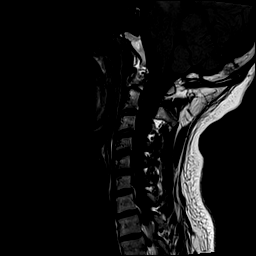
[im 12/15]
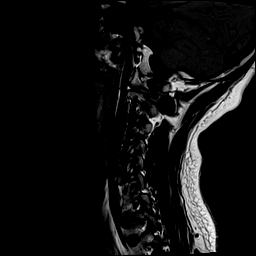
[im 15/15]
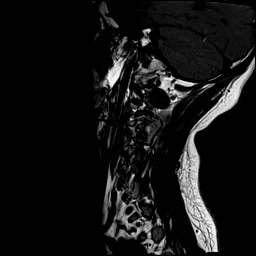

[Series 4: STIR · sagittal · 3.0mm · 0.69mm/px · 7 of 15 slices shown]
[im 1/15]
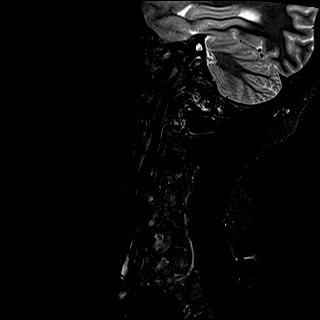
[im 3/15]
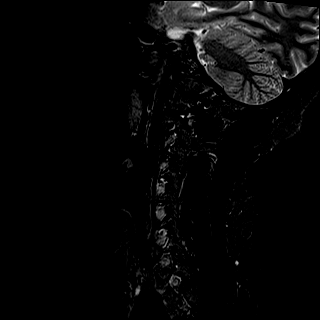
[im 5/15]
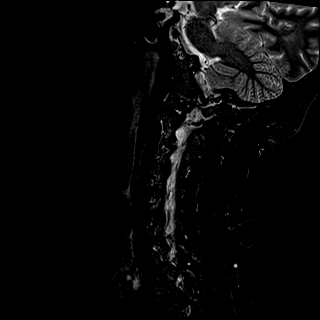
[im 8/15]
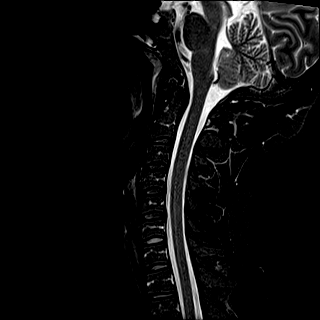
[im 10/15]
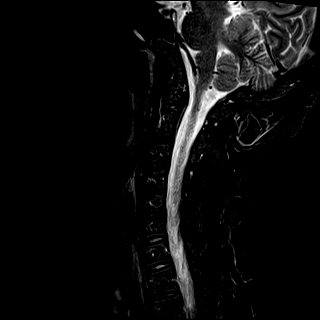
[im 12/15]
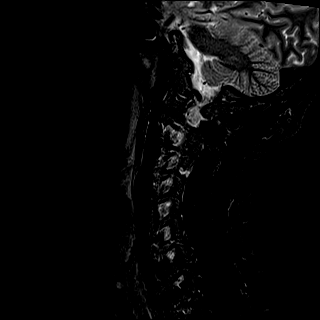
[im 15/15]
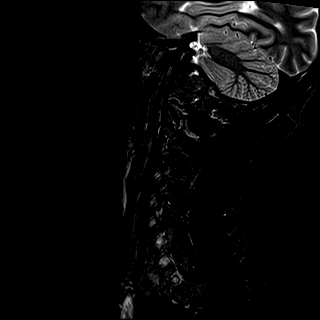

[Series 5: T2 · axial · 3.0mm · 0.62mm/px · z∈[-114,-10]mm · 14 of 32 slices shown (2 of 2)]
[im 1/32]
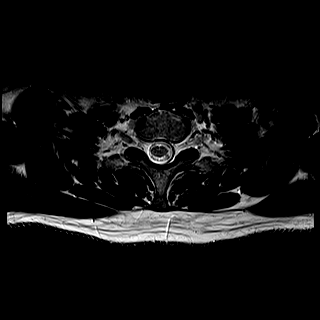
[im 3/32]
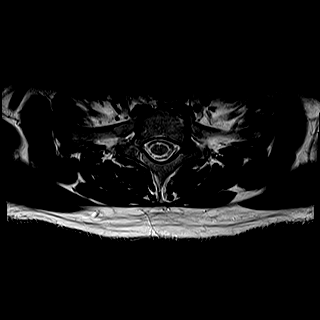
[im 5/32]
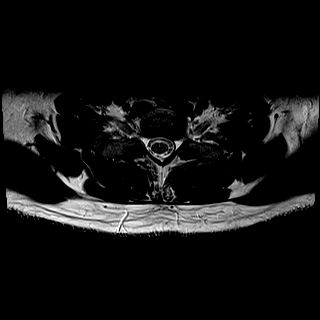
[im 8/32]
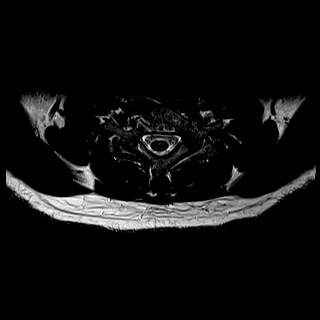
[im 10/32]
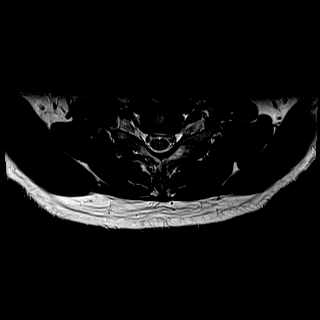
[im 12/32]
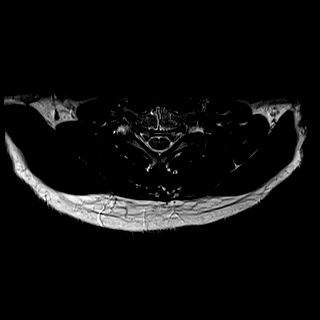
[im 15/32]
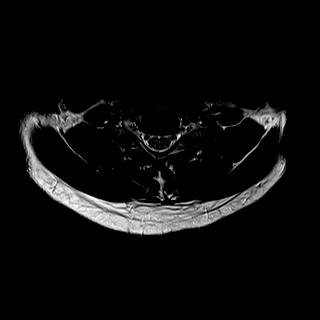
[im 17/32]
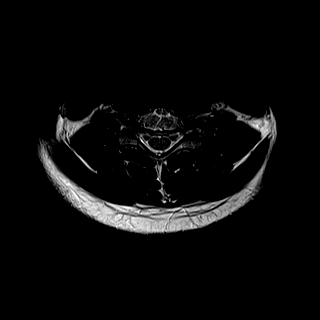
[im 20/32]
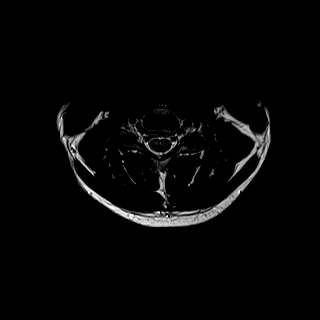
[im 22/32]
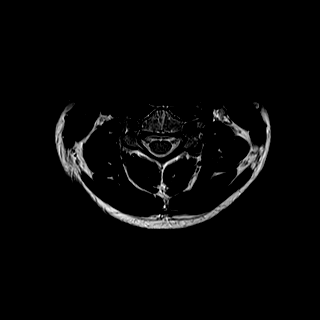
[im 24/32]
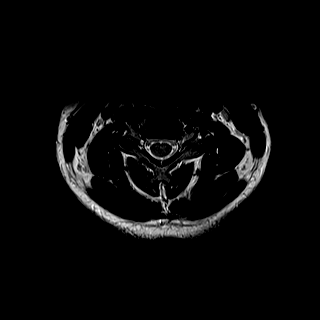
[im 27/32]
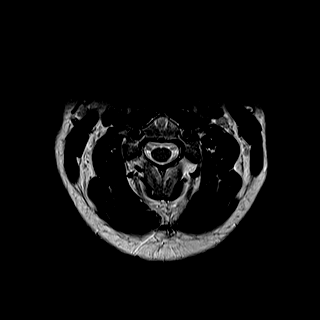
[im 29/32]
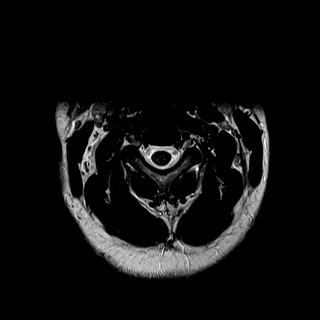
[im 32/32]
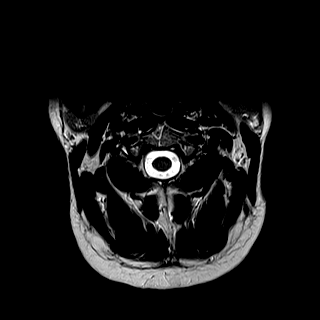

[Series 6: mpgr ax · axial · 3.0mm · 0.35mm/px · z∈[-104,+0]mm · 8 of 32 slices shown]
[im 1/32]
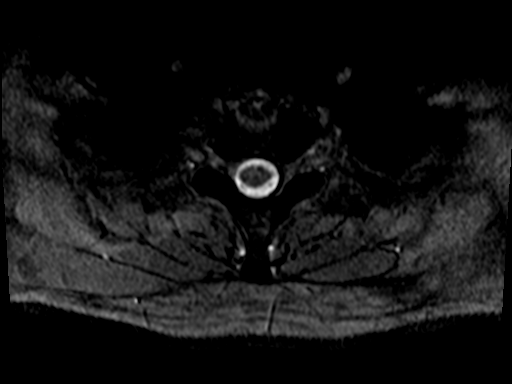
[im 5/32]
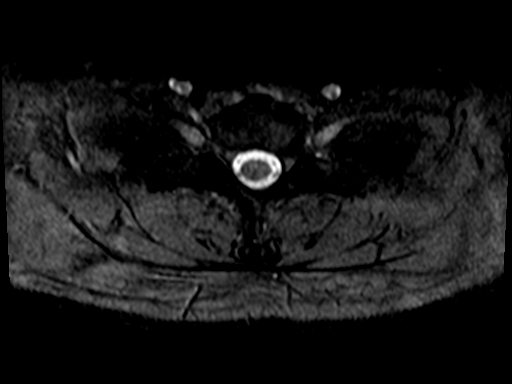
[im 10/32]
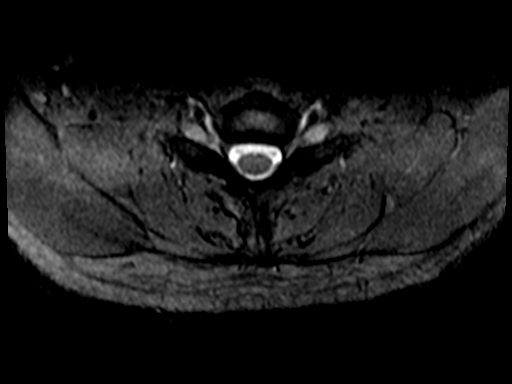
[im 15/32]
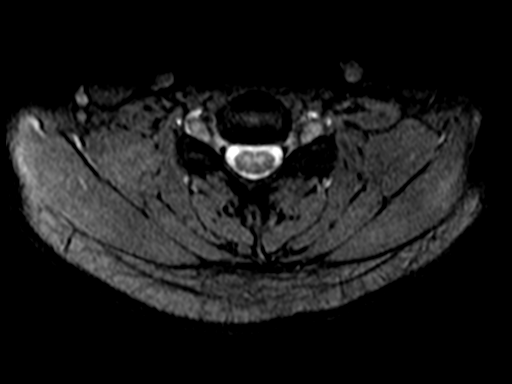
[im 17/32]
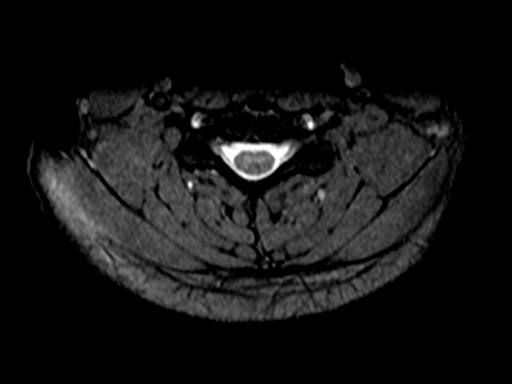
[im 22/32]
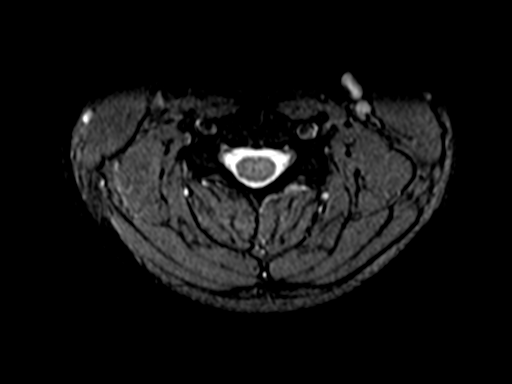
[im 27/32]
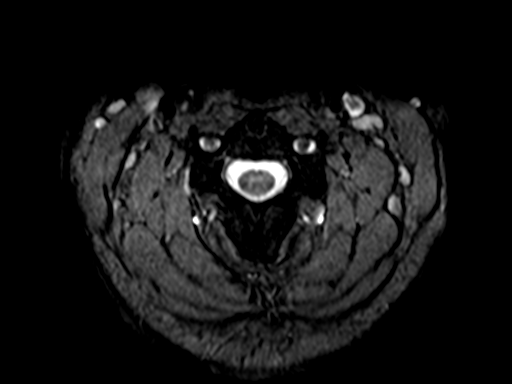
[im 32/32]
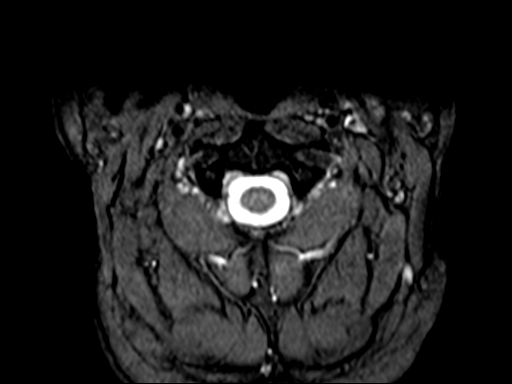

[42 of 48 positions shown; findings below may reference images not displayed]

FINDINGS: Alignment: Preserved.

Vertebrae: Vertebral body heights are maintained. There is no marrow
edema. No suspicious osseous lesion.

Cord: No abnormal signal.

Posterior Fossa, vertebral arteries, paraspinal tissues:
Unremarkable.

Disc levels: Intervertebral disc heights and signal are maintained.
Trace disc bulge at C5-C6. There is no canal or foraminal narrowing
at any level.
IMPRESSION: No significant abnormality.

## 2021-06-19 ENCOUNTER — Encounter: Payer: Self-pay | Admitting: Sports Medicine

## 2021-06-19 DIAGNOSIS — R0789 Other chest pain: Secondary | ICD-10-CM

## 2021-06-19 DIAGNOSIS — R0781 Pleurodynia: Secondary | ICD-10-CM

## 2021-06-23 ENCOUNTER — Ambulatory Visit: Payer: Commercial Managed Care - PPO | Admitting: Physician Assistant

## 2021-06-29 DIAGNOSIS — R0781 Pleurodynia: Secondary | ICD-10-CM | POA: Insufficient documentation

## 2021-06-29 DIAGNOSIS — R0789 Other chest pain: Secondary | ICD-10-CM | POA: Insufficient documentation

## 2021-06-29 NOTE — Assessment & Plan Note (Addendum)
Right-sided popping rib, patient requesting MRI, I have ordered the MRI. He has had x-rays within the past couple of months here and with his chiropractor. If the MRI does not get approved he just needs to pay for it out-of-pocket.

## 2021-07-08 ENCOUNTER — Ambulatory Visit (INDEPENDENT_AMBULATORY_CARE_PROVIDER_SITE_OTHER): Payer: Commercial Managed Care - PPO

## 2021-07-08 ENCOUNTER — Other Ambulatory Visit: Payer: Self-pay

## 2021-07-08 DIAGNOSIS — R0781 Pleurodynia: Secondary | ICD-10-CM

## 2021-07-08 DIAGNOSIS — R0789 Other chest pain: Secondary | ICD-10-CM | POA: Diagnosis not present

## 2021-07-08 IMAGING — MR MR CHEST MEDIASTINUM W/O CM
7 series · 16 of 16 positions shown · non-contrast
Comparison: None.

CLINICAL DATA: Chest wall pain, nontraumatic, infection or
inflammation suspected, xray done Chronic right-sided popping
rib/rib pain

EXAM:
MR CHEST WITHOUT CONTRAST
TECHNIQUE: Multiplanar, multisequence MR imaging of the chest was performed. No
intravenous contrast was administered.

[Series 3: T1 · axial · 4.0mm · 0.55mm/px · z∈[-92,+203]mm · 3 of 60 slices shown (1 of 2)]
[im 1/60]
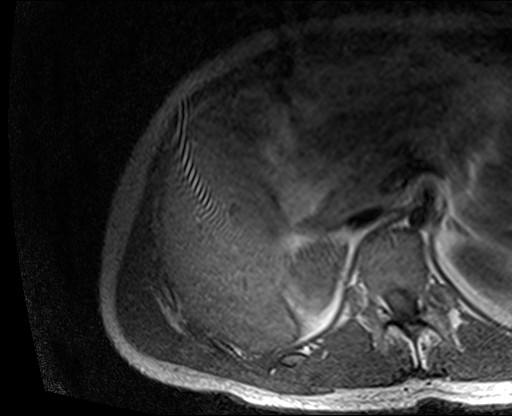
[im 30/60]
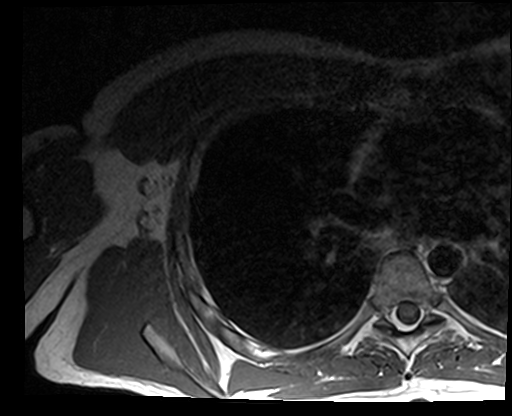
[im 60/60]
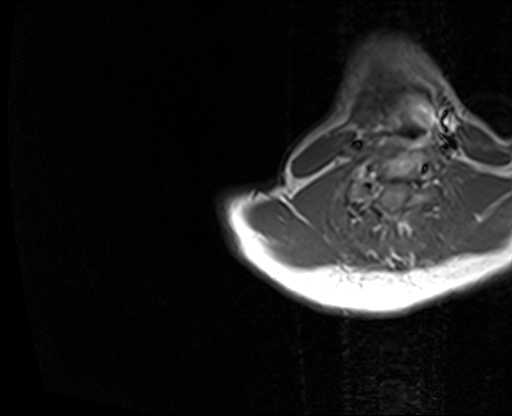

[Series 4: T2 fat-sat · axial · 4.0mm · 0.55mm/px · z∈[-44,+201]mm · 2 of 50 slices shown]
[im 1/50]
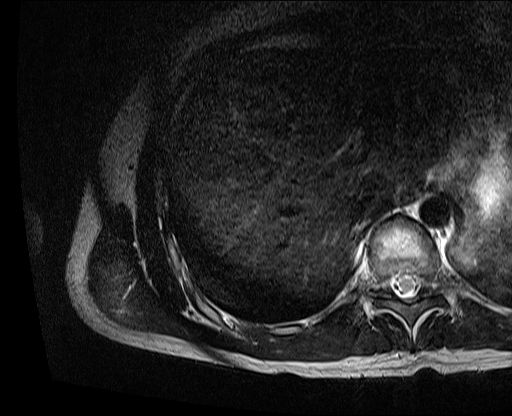
[im 50/50]
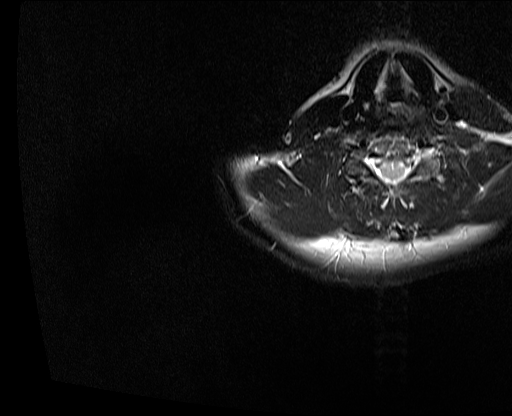

[Series 5: T1 fat-sat · axial · 4.0mm · 0.55mm/px · z∈[-44,+201]mm · 3 of 50 slices shown]
[im 1/50]
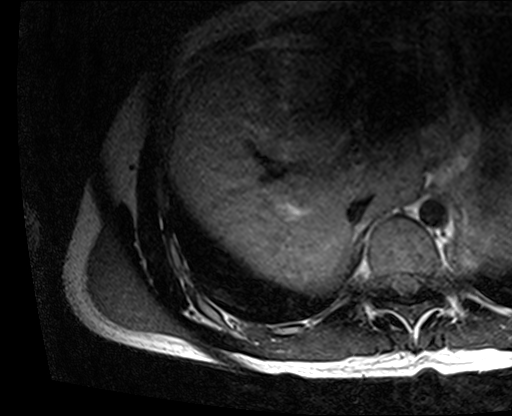
[im 25/50]
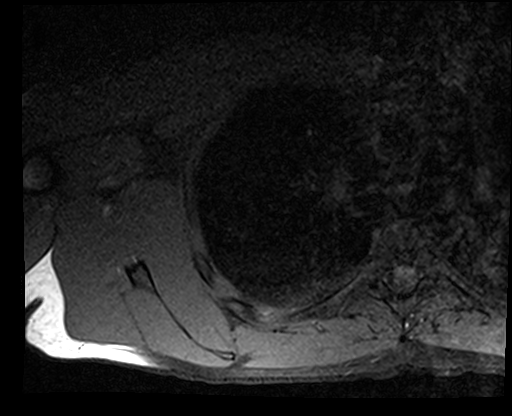
[im 50/50]
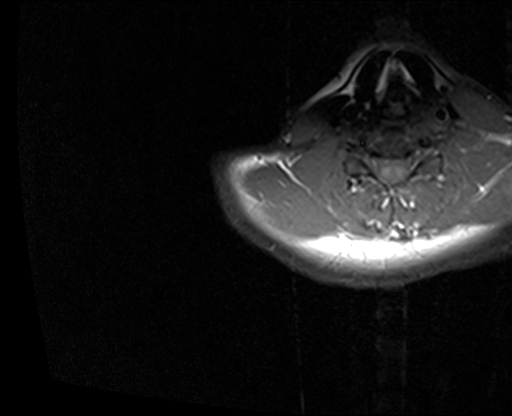

[Series 6: T1 · coronal · 4.0mm · 0.59mm/px · 2 of 25 slices shown (2 of 2)]
[im 1/25]
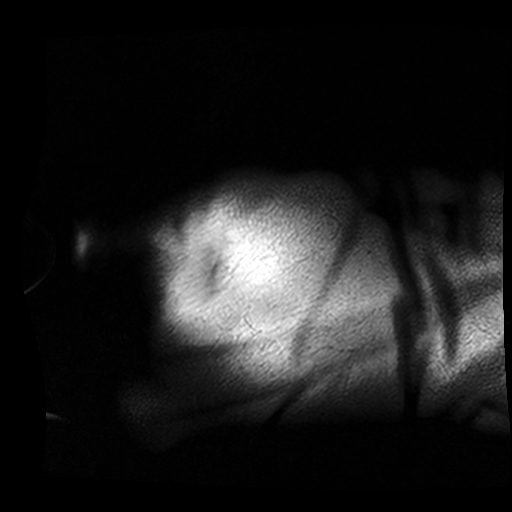
[im 25/25]
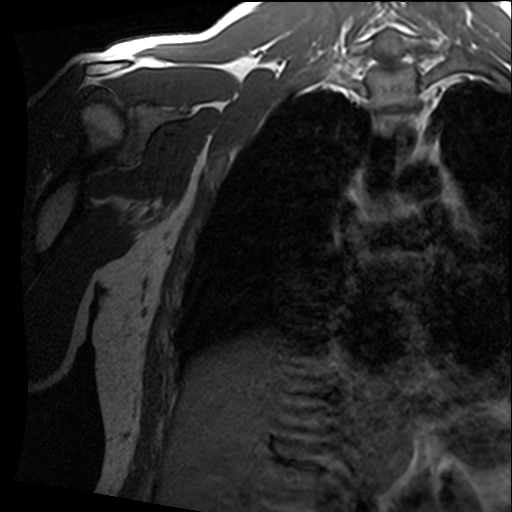

[Series 7: STIR · coronal · 4.0mm · 1.17mm/px · 2 of 25 slices shown (1 of 2)]
[im 1/25]
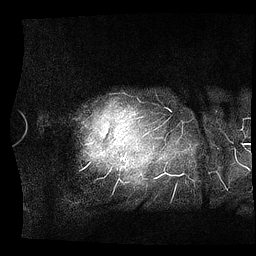
[im 25/25]
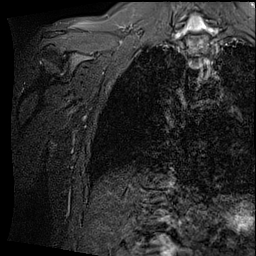

[Series 8: PD fat-sat · sagittal · 6.0mm · 0.78mm/px · 2 of 26 slices shown]
[im 1/26]
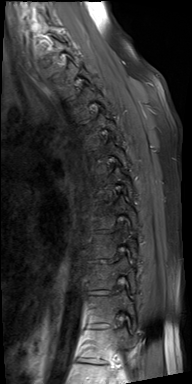
[im 26/26]
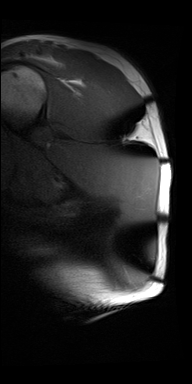

[Series 9: STIR · sagittal · 6.0mm · 1.17mm/px · 2 of 26 slices shown (2 of 2)]
[im 1/26]
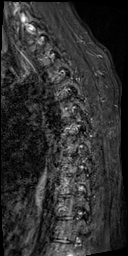
[im 26/26]
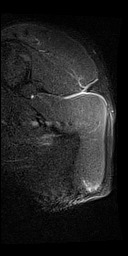

[16 of 16 positions shown; findings below may reference images not displayed]

FINDINGS: Note that the field of view is limited to the mid to posterior right
chest, with artifact limiting evaluation of the anterior chest,
mediastinum, and right shoulder.

Bones/Joint/Cartilage:

There is no evidence of acute rib fracture or thoracic spine
fracture. There is no evidence of acute fracture of the right
scapula. No significant marrow signal alteration.

Ligaments:

Grossly intact

Muscles and Tendons:

There is mild intramuscular edema within the infraspinatus
inferiorly. (Coronal stir image 4, axial T2 image 20). The
paraspinal soft tissues are unremarkable.

Soft tissue:

No other intrathoracic signal abnormality. No lymphadenopathy. No
evidence of a chest wall mass. No focal fluid collection.
IMPRESSION: Mild intramuscular edema within the infraspinatus, consistent with
low-grade muscle strain.

No other acute abnormality involving the right ribs or chest wall.

## 2021-07-10 ENCOUNTER — Encounter: Payer: Self-pay | Admitting: Sports Medicine

## 2021-07-25 ENCOUNTER — Ambulatory Visit: Payer: Commercial Managed Care - PPO | Admitting: Sports Medicine

## 2021-07-28 ENCOUNTER — Ambulatory Visit (INDEPENDENT_AMBULATORY_CARE_PROVIDER_SITE_OTHER): Payer: Commercial Managed Care - PPO | Admitting: Medical-Surgical

## 2021-07-28 ENCOUNTER — Other Ambulatory Visit: Payer: Self-pay

## 2021-07-28 ENCOUNTER — Encounter: Payer: Self-pay | Admitting: Medical-Surgical

## 2021-07-28 VITALS — BP 127/85 | HR 67 | Resp 20 | Ht 69.0 in | Wt 175.9 lb

## 2021-07-28 DIAGNOSIS — Z7689 Persons encountering health services in other specified circumstances: Secondary | ICD-10-CM

## 2021-07-28 DIAGNOSIS — R42 Dizziness and giddiness: Secondary | ICD-10-CM

## 2021-07-28 NOTE — Progress Notes (Signed)
HPI with pertinent ROS:   CC: transfer of care  HPI: Pleasant 31 year old male presenting today for transfer of care to a new PCP.  He has been seeing Dr. Darene Lamer for musculoskeletal issues recently and had multiple evaluations of his spine.  Unfortunately there were no specific issues found and that has been very frustrating.  He does have a follow-up with Dr. Darene Lamer scheduled.  Since December, has had nonspecific dizziness that he describes as feeling off balance instead of spinning.  In December, his issues started after he did an ice bath.  A few hours later he got very dizzy which lasted for 2 to 3 days.  After that it got better but he still has lingering issues on a daily basis he is going to physical therapy and reports they do not think his issues are related to BPPV.  He is also seeing a chiropractor since this issue in December, he is unable to drink coffee as he gets extremely lightheaded about 1 hour later.  He is physically active and notes that after exercise, his dizziness is better.  Feels that using a rowing machine, walking, and yoga are all equally beneficial.  Notes that his dizziness is worse between 10 AM and 3 PM each day with a slight improvement in the afternoon.  Has not been evaluated by neurology but is open to a referral.  I reviewed the past medical history, family history, social history, surgical history, and allergies today and no changes were needed.  Please see the problem list section below in epic for further details.  Physical exam:   General: Well Developed, well nourished, and in no acute distress.  Neuro: Alert and oriented x3.  HEENT: Normocephalic, atraumatic.  Skin: Warm and dry. Cardiac: Regular rate and rhythm, no murmurs rubs or gallops, no lower extremity edema.  Respiratory: Clear to auscultation bilaterally. Not using accessory muscles, speaking in full sentences.  Impression and Recommendations:    1. Encounter to establish care Reviewed available  information and discussed care concerns with patient.   2. Intermittent lightheadedness Unclear etiology to nonspecific dizziness.  Referring to neurology for further continue physical therapy.  Checking labs as below. - Ambulatory referral to Neurology - CBC - COMPLETE METABOLIC PANEL WITH GFR - TSH - Sed Rate (ESR) - C-reactive protein  Return in about 6 months (around 01/25/2022) for general follow up or sooner if needed.  Further follow-up pending lab results. ___________________________________________ Clearnce Sorrel, DNP, APRN, FNP-BC Primary Care and Sports Medicine [place]

## 2021-07-29 LAB — COMPLETE METABOLIC PANEL WITH GFR
AG Ratio: 2 (calc) (ref 1.0–2.5)
ALT: 21 U/L (ref 9–46)
AST: 20 U/L (ref 10–40)
Albumin: 5.5 g/dL — ABNORMAL HIGH (ref 3.6–5.1)
Alkaline phosphatase (APISO): 80 U/L (ref 36–130)
BUN: 14 mg/dL (ref 7–25)
CO2: 30 mmol/L (ref 20–32)
Calcium: 10.5 mg/dL — ABNORMAL HIGH (ref 8.6–10.3)
Chloride: 100 mmol/L (ref 98–110)
Creat: 0.76 mg/dL (ref 0.60–1.24)
Globulin: 2.8 g/dL (calc) (ref 1.9–3.7)
Glucose, Bld: 89 mg/dL (ref 65–99)
Potassium: 4.3 mmol/L (ref 3.5–5.3)
Sodium: 140 mmol/L (ref 135–146)
Total Bilirubin: 0.9 mg/dL (ref 0.2–1.2)
Total Protein: 8.3 g/dL — ABNORMAL HIGH (ref 6.1–8.1)
eGFR: 125 mL/min/{1.73_m2} (ref >=60)

## 2021-07-29 LAB — CBC
HCT: 45.6 % (ref 38.5–50.0)
Hemoglobin: 16 g/dL (ref 13.2–17.1)
MCH: 31.6 pg (ref 27.0–33.0)
MCHC: 35.1 g/dL (ref 32.0–36.0)
MCV: 89.9 fL (ref 80.0–100.0)
MPV: 11.6 fL (ref 7.5–12.5)
Platelets: 235 10*3/uL (ref 140–400)
RBC: 5.07 10*6/uL (ref 4.20–5.80)
RDW: 12.3 % (ref 11.0–15.0)
WBC: 6.4 10*3/uL (ref 3.8–10.8)

## 2021-07-29 LAB — SEDIMENTATION RATE: Sed Rate: 11 mm/h (ref 0–15)

## 2021-07-29 LAB — TSH: TSH: 3.56 mIU/L (ref 0.40–4.50)

## 2021-07-29 LAB — C-REACTIVE PROTEIN: CRP: 2.2 mg/L (ref <8.0)

## 2021-08-17 ENCOUNTER — Ambulatory Visit (INDEPENDENT_AMBULATORY_CARE_PROVIDER_SITE_OTHER): Payer: Commercial Managed Care - PPO | Admitting: Family Medicine

## 2021-08-17 ENCOUNTER — Ambulatory Visit (INDEPENDENT_AMBULATORY_CARE_PROVIDER_SITE_OTHER): Payer: Commercial Managed Care - PPO

## 2021-08-17 ENCOUNTER — Encounter: Payer: Self-pay | Admitting: Family Medicine

## 2021-08-17 ENCOUNTER — Encounter: Payer: Self-pay | Admitting: Medical-Surgical

## 2021-08-17 ENCOUNTER — Other Ambulatory Visit: Payer: Self-pay

## 2021-08-17 VITALS — BP 128/77 | HR 69 | Ht 69.0 in | Wt 173.0 lb

## 2021-08-17 DIAGNOSIS — R109 Unspecified abdominal pain: Secondary | ICD-10-CM

## 2021-08-17 DIAGNOSIS — R350 Frequency of micturition: Secondary | ICD-10-CM

## 2021-08-17 IMAGING — DX DG ABDOMEN 1V
2 series · 2 of 2 positions shown · non-contrast
Comparison: None.

CLINICAL DATA: Left flank pain, urinary frequency, evaluate for
stone.

EXAM:
ABDOMEN - 1 VIEW

[abdomen kub (1 of 2)]
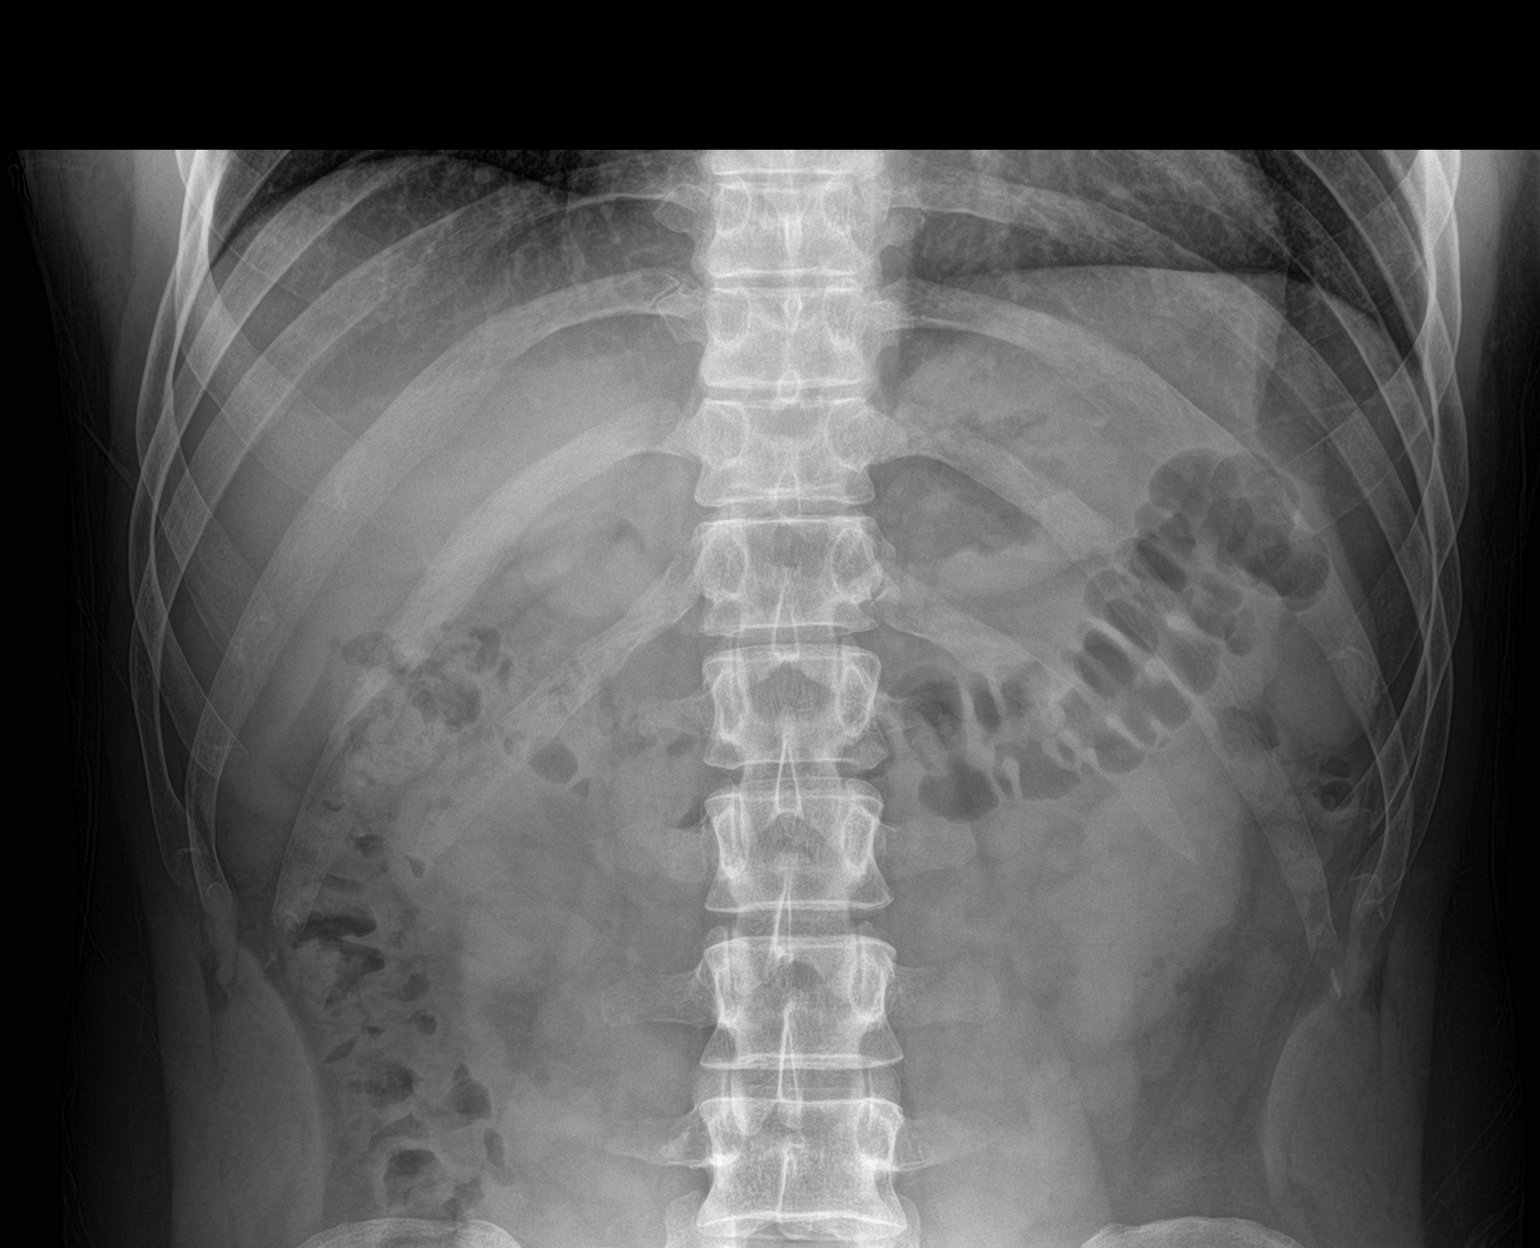

[abdomen kub (2 of 2)]
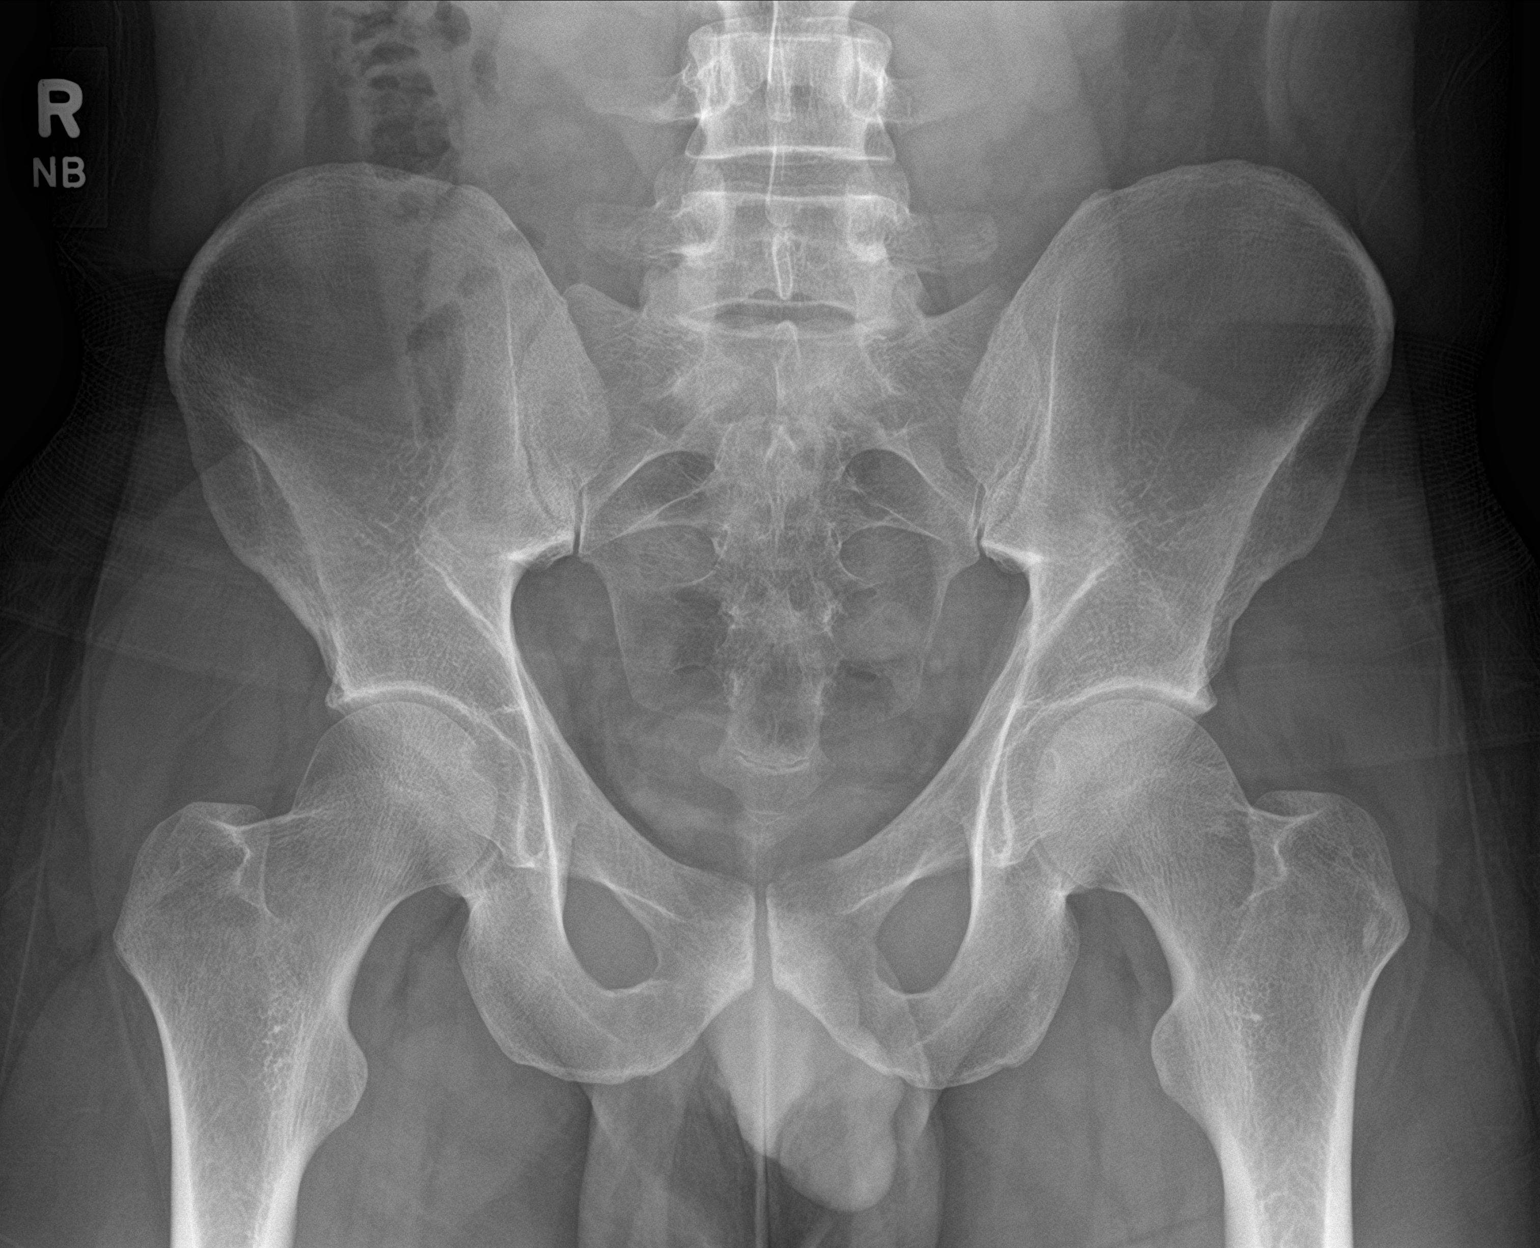

[2 of 2 positions shown; findings below may reference images not displayed]

FINDINGS: The bowel gas pattern is normal. No radio-opaque calculi or other
significant radiographic abnormality are seen.
IMPRESSION: Negative.

## 2021-08-17 NOTE — Patient Instructions (Signed)
We'll be in touch with xray results and further recommendations.   ?

## 2021-08-17 NOTE — Progress Notes (Signed)
?Dean Spence - 31 y.o. male MRN [id number]  Date of birth: [birth year] ? ?Subjective ?Chief Complaint  ?Patient presents with  ? Flank Pain  ? Urinary Tract Infection  ? ? ?HPI ?Dean Spence is a 31 year old male here today with complaint of urinary frequency and flank pain.  Reports he started having urinary frequency a few days ago going multiple times throughout the day and having sensations that he needed to go even though he just urinated.  He began having left-sided flank pain yesterday.  Describes pain as severe pain.  As of now his symptoms have essentially resolved.  Urinary frequency has improved today.  He is also not having any pain.  He denies any hematuria, fever, chills. ? ?ROS:  A comprehensive ROS was completed and negative except as noted per HPI ? ?No Known Allergies ? ?History reviewed. No pertinent past medical history. ? ?Past Surgical History:  ?Procedure Laterality Date  ? KNEE SURGERY    ? ? ?Social History  ? ?Socioeconomic History  ? Marital status: Married  ?  Spouse name: Not on file  ? Number of children: Not on file  ? Years of education: Not on file  ? Highest education level: Not on file  ?Occupational History  ? Occupation: Firefighter  ?Tobacco Use  ? Smoking status: Never  ? Smokeless tobacco: Never  ?Substance and Sexual Activity  ? Alcohol use: Yes  ? Drug use: Not on file  ? Sexual activity: Yes  ?  Partners: Female  ?Other Topics Concern  ? Not on file  ?Social History Narrative  ? Not on file  ? ?Social Determinants of Health  ? ?Financial Resource Strain: Not on file  ?Food Insecurity: Not on file  ?Transportation Needs: Not on file  ?Physical Activity: Not on file  ?Stress: Not on file  ?Social Connections: Not on file  ? ? ?History reviewed. No pertinent family history. ? ?Health Maintenance  ?Topic Date Due  ? HIV Screening  Never done  ? Hepatitis C Screening  Never done  ? COVID-19 Vaccine (3 - Booster for Moderna series) 12/05/2019  ? TETANUS/TDAP  03/03/2028   ? INFLUENZA VACCINE  Completed  ? HPV VACCINES  Aged Out  ? ? ? ?----------------------------------------------------------------------------------------------------------------------------------------------------------------------------------------------------------------- ?Physical Exam ?BP 128/77 (BP Location: Left Arm, Patient Position: Sitting, Cuff Size: Normal)   Pulse 69   Ht 5\' 9"  (1.753 m)   Wt 173 lb (78.5 kg)   SpO2 96%   BMI 25.55 kg/m?  ? ?Physical Exam ?Constitutional:   ?   Appearance: Normal appearance.  ?Eyes:  ?   General: No scleral icterus. ?Cardiovascular:  ?   Rate and Rhythm: Normal rate and regular rhythm.  ?Pulmonary:  ?   Effort: Pulmonary effort is normal.  ?   Breath sounds: Normal breath sounds.  ?Abdominal:  ?   General: Abdomen is flat.  ?   Palpations: Abdomen is soft.  ?   Tenderness: There is no right CVA tenderness or left CVA tenderness.  ?Musculoskeletal:  ?   Cervical back: Neck supple.  ?Neurological:  ?   General: No focal deficit present.  ?   Mental Status: He is alert.  ?Psychiatric:     ?   Mood and Affect: Mood normal.     ?   Behavior: Behavior normal.  ? ? ?------------------------------------------------------------------------------------------------------------------------------------------------------------------------------------------------------------------- ?Assessment and Plan ? ?Left flank pain ?Left flank pain as Spence as has urinary frequency concerning for kidney stone.  Urinalysis is normal and I  have low suspicion for infection at this time.  Improvement of symptoms as Spence as normal urinalysis may indicate that he has already passed a stone.  KUB ordered today. ? ?I personally reviewed KUB.  I do not see any stones within the ureter, kidney or bladder area.  Contact the patient to relay these results.  Discussed that if symptoms return would recommend CT scan. ? ? ?No orders of the defined types were placed in this encounter. ? ? ?No follow-ups on  file. ? ? ? ?This visit occurred during the SARS-CoV-2 public health emergency.  Safety protocols were in place, including screening questions prior to the visit, additional usage of staff PPE, and extensive cleaning of exam room while observing appropriate contact time as indicated for disinfecting solutions.  ? ?

## 2021-08-17 NOTE — Assessment & Plan Note (Addendum)
Left flank pain as well as has urinary frequency concerning for kidney stone.  Urinalysis is normal and I have low suspicion for infection at this time.  Improvement of symptoms as well as normal urinalysis may indicate that he has already passed a stone.  KUB ordered today. ? ?I personally reviewed KUB.  I do not see any stones within the ureter, kidney or bladder area.  Contact the patient to relay these results.  Discussed that if symptoms return would recommend CT scan. ?

## 2021-09-11 ENCOUNTER — Encounter: Payer: Self-pay | Admitting: Medical-Surgical

## 2021-09-11 DIAGNOSIS — R42 Dizziness and giddiness: Secondary | ICD-10-CM

## 2021-09-11 DIAGNOSIS — R519 Headache, unspecified: Secondary | ICD-10-CM

## 2021-09-12 NOTE — Telephone Encounter (Signed)
Referral pended, sign if appropriate.  ?

## 2021-09-13 ENCOUNTER — Other Ambulatory Visit: Payer: Self-pay | Admitting: Medical-Surgical

## 2021-09-13 DIAGNOSIS — R42 Dizziness and giddiness: Secondary | ICD-10-CM

## 2021-09-13 NOTE — Addendum Note (Signed)
Addended byChristen Butter on: 09/13/2021 08:29 PM ? ? Modules accepted: Orders ? ?

## 2021-09-15 ENCOUNTER — Ambulatory Visit (INDEPENDENT_AMBULATORY_CARE_PROVIDER_SITE_OTHER): Payer: Commercial Managed Care - PPO

## 2021-09-15 DIAGNOSIS — R519 Headache, unspecified: Secondary | ICD-10-CM

## 2021-09-15 DIAGNOSIS — R42 Dizziness and giddiness: Secondary | ICD-10-CM | POA: Diagnosis not present

## 2021-09-15 DIAGNOSIS — R29818 Other symptoms and signs involving the nervous system: Secondary | ICD-10-CM | POA: Diagnosis not present

## 2021-09-15 IMAGING — CT CT HEAD W/O CM
3 of 4 series · 13 of 47 positions shown, 15 images · non-contrast
Comparison: Head CT [DATE].

CLINICAL DATA: Intermittent lightheadedness. Headache with
neurologic deficit. Dizziness, nonspecific; headache, new or
worsening, neuro deficit. Additional history provided by scanning
technologist: Patient reports headache and dizziness for 3 hours
following and ice bath.



[Series 2: head wo · axial · 0.46mm/px · z∈[-148,-28]mm · 7 of 33 slices shown, 9 images]
[im 5/33  brain]
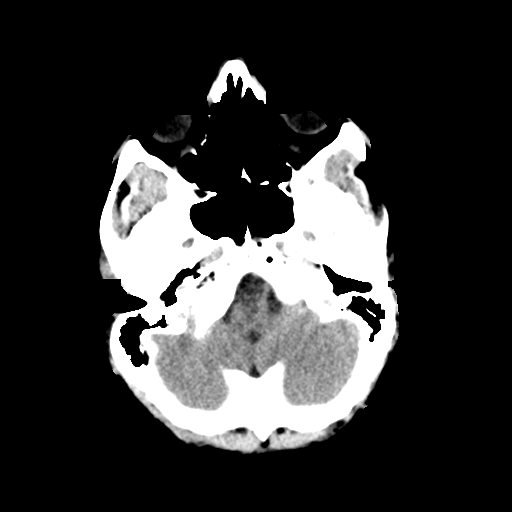
[im 5/33  bone]
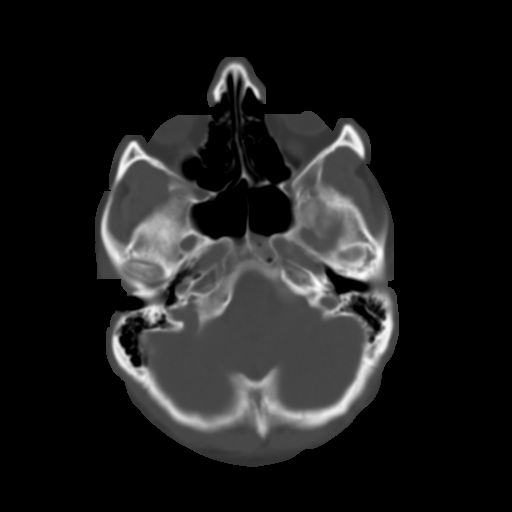
[im 9/33  brain]
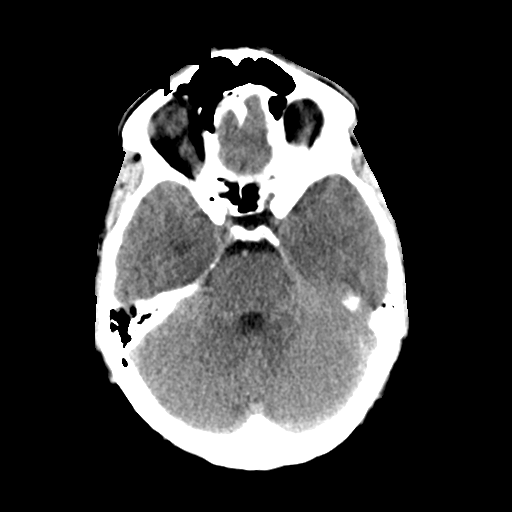
[im 13/33  brain]
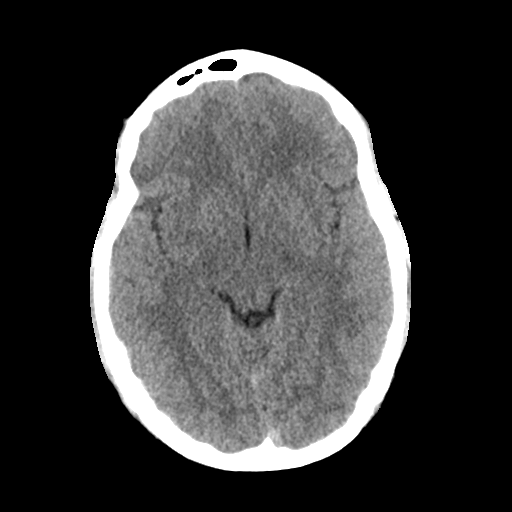
[im 17/33  brain]
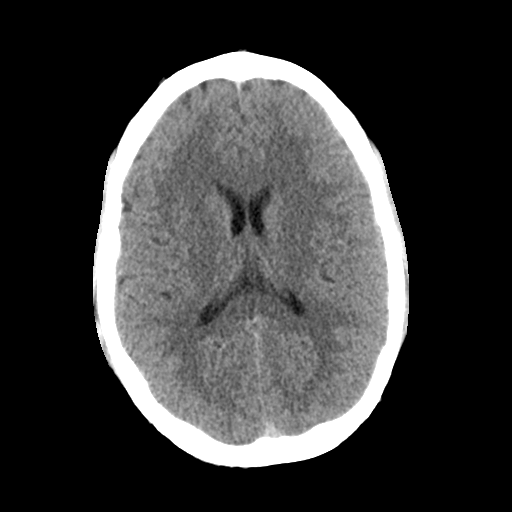
[im 21/33  brain]
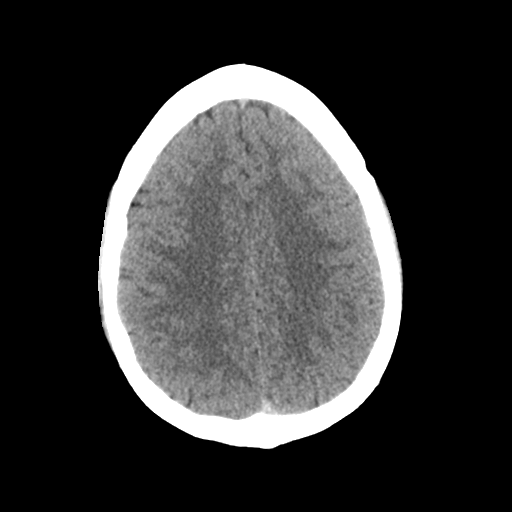
[im 21/33  bone]
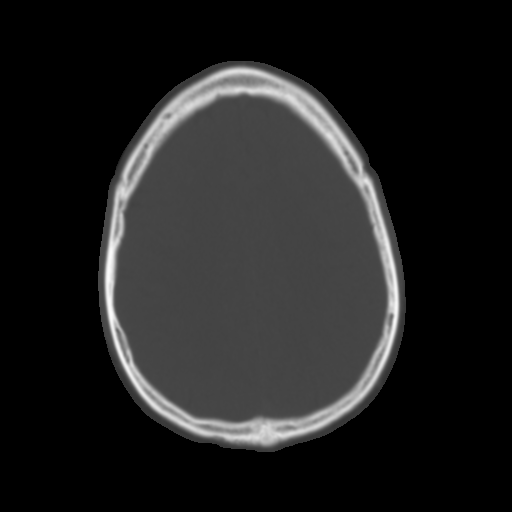
[im 25/33  brain]
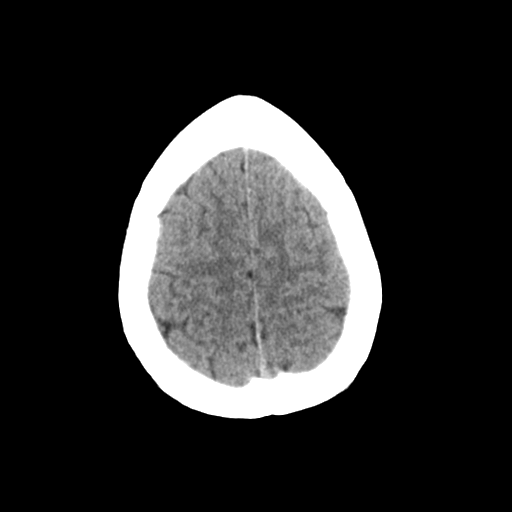
[im 29/33  brain]
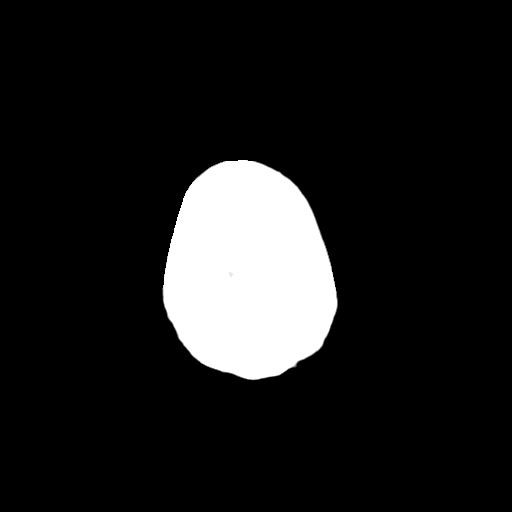

[Series 4: head wo coronal · coronal · 0.31mm/px · 3 of 72 slices shown]
[im 24/72  brain]
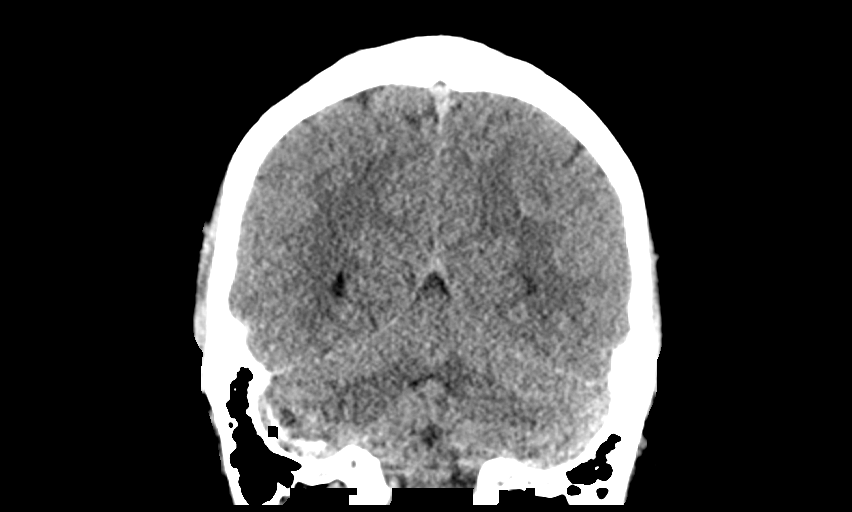
[im 32/72  brain]
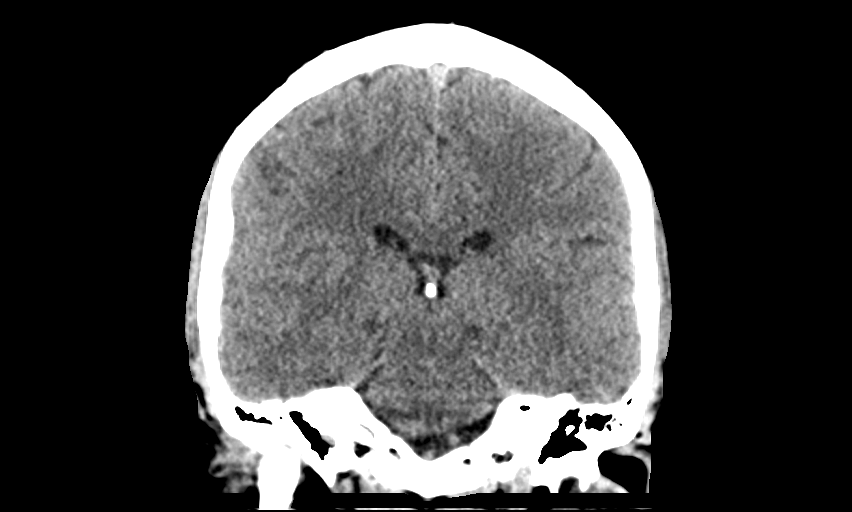
[im 40/72  brain]
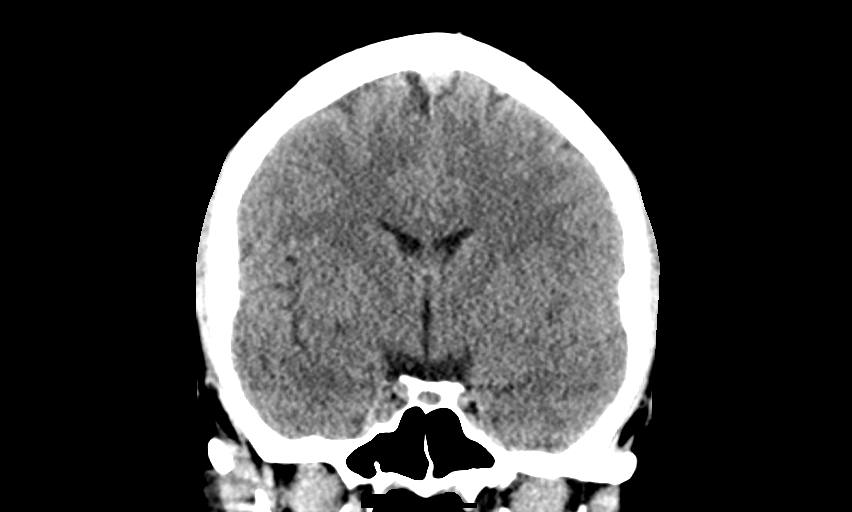

[Series 5: head wo sagittal · sagittal · 0.31mm/px · 3 of 58 slices shown]
[im 20/58  brain]
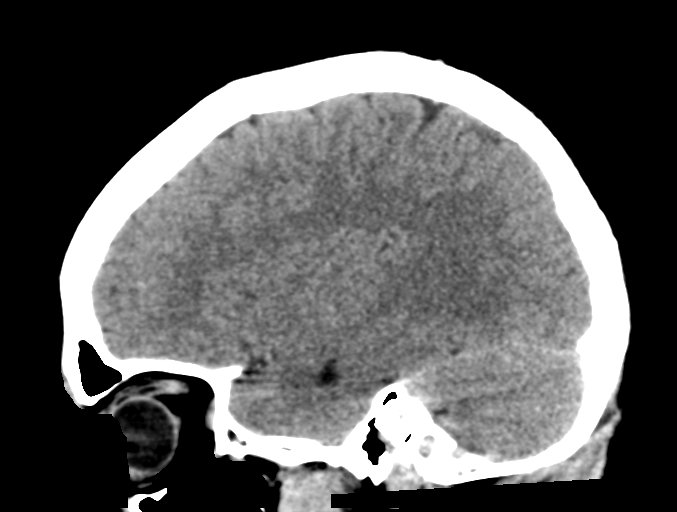
[im 29/58  brain]
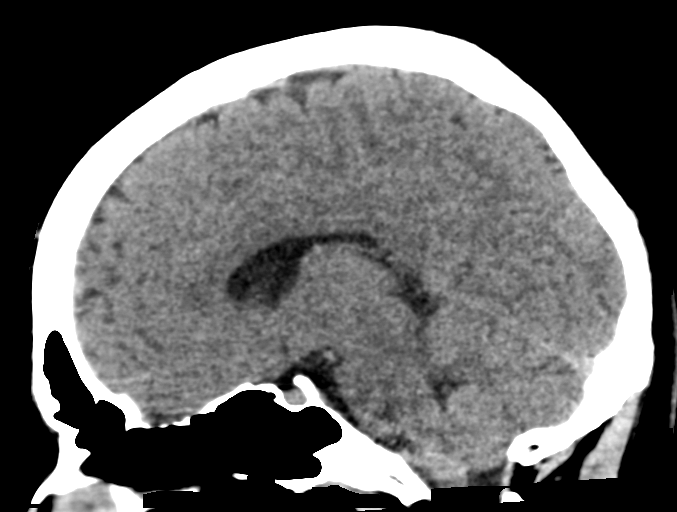
[im 39/58  brain]
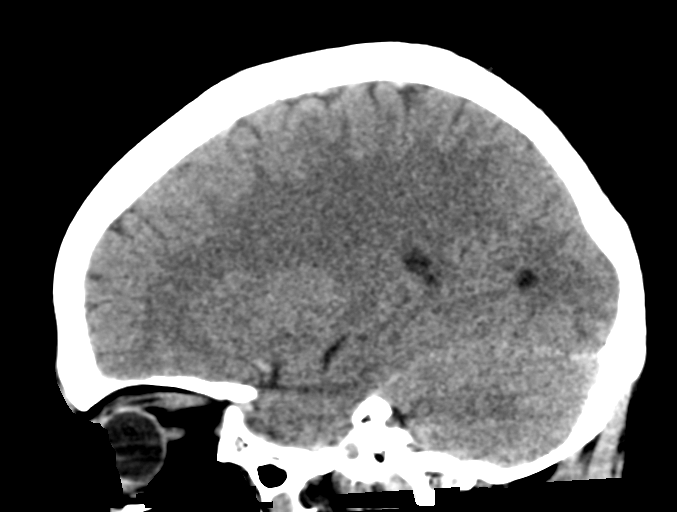

[13 of 47 positions shown; findings below may reference images not displayed]

FINDINGS: Brain:

Cerebral volume is normal.

There is no acute intracranial hemorrhage.

No demarcated cortical infarct.

No extra-axial fluid collection.

No evidence of an intracranial mass.

No midline shift.

Vascular: No hyperdense vessel.

Skull: Normal. Negative for fracture or focal lesion.

Sinuses/Orbits: Visualized orbits show no acute finding. Small to
moderate volume frothy secretions within the left sphenoid sinus.
Tiny mucous retention cyst within the right sphenoid sinus.
IMPRESSION: Unremarkable non-contrast CT appearance of the brain. No evidence of
acute intracranial abnormality.

Left sphenoid sinusitis.

## 2021-09-17 ENCOUNTER — Other Ambulatory Visit: Payer: Self-pay | Admitting: Medical-Surgical

## 2021-09-17 ENCOUNTER — Encounter: Payer: Self-pay | Admitting: Medical-Surgical

## 2021-09-17 MED ORDER — CEFDINIR 300 MG PO CAPS: 300.0000 mg | ORAL_CAPSULE | Freq: Two times a day (BID) | ORAL | 0 refills | Status: AC

## 2022-01-26 ENCOUNTER — Ambulatory Visit: Payer: Commercial Managed Care - PPO | Admitting: Medical-Surgical

## 2024-02-04 ENCOUNTER — Encounter: Payer: Self-pay | Admitting: Sports Medicine
